# Patient Record
Sex: Male | Born: 1945 | Race: Black or African American | Hispanic: No | Marital: Married | Smoking: Never smoker
Health system: Southern US, Community
[De-identification: ages and names within clinical notes are randomized; demographics above are authoritative.]

## PROBLEM LIST (undated history)

## (undated) DIAGNOSIS — I10 Essential (primary) hypertension: Secondary | ICD-10-CM

## (undated) DIAGNOSIS — M1990 Unspecified osteoarthritis, unspecified site: Secondary | ICD-10-CM

## (undated) HISTORY — PX: NO PAST SURGERIES: SHX2092

---

## 2016-05-19 DIAGNOSIS — T464X5A Adverse effect of angiotensin-converting-enzyme inhibitors, initial encounter: Secondary | ICD-10-CM | POA: Insufficient documentation

## 2016-05-19 DIAGNOSIS — Z87440 Personal history of urinary (tract) infections: Secondary | ICD-10-CM | POA: Insufficient documentation

## 2016-05-19 DIAGNOSIS — N400 Enlarged prostate without lower urinary tract symptoms: Secondary | ICD-10-CM | POA: Insufficient documentation

## 2016-05-19 DIAGNOSIS — I10 Essential (primary) hypertension: Secondary | ICD-10-CM | POA: Insufficient documentation

## 2016-05-19 DIAGNOSIS — I493 Ventricular premature depolarization: Secondary | ICD-10-CM | POA: Insufficient documentation

## 2016-05-19 DIAGNOSIS — R3129 Other microscopic hematuria: Secondary | ICD-10-CM | POA: Insufficient documentation

## 2016-05-19 DIAGNOSIS — Z125 Encounter for screening for malignant neoplasm of prostate: Secondary | ICD-10-CM | POA: Insufficient documentation

## 2016-05-19 DIAGNOSIS — T783XXA Angioneurotic edema, initial encounter: Secondary | ICD-10-CM | POA: Insufficient documentation

## 2018-12-01 DIAGNOSIS — M170 Bilateral primary osteoarthritis of knee: Secondary | ICD-10-CM | POA: Insufficient documentation

## 2019-08-24 DIAGNOSIS — M1612 Unilateral primary osteoarthritis, left hip: Secondary | ICD-10-CM | POA: Insufficient documentation

## 2019-08-24 DIAGNOSIS — M1611 Unilateral primary osteoarthritis, right hip: Secondary | ICD-10-CM | POA: Insufficient documentation

## 2020-05-07 ENCOUNTER — Encounter: Payer: Self-pay | Admitting: Orthopaedic Surgery

## 2020-05-07 ENCOUNTER — Ambulatory Visit: Payer: Self-pay

## 2020-05-07 ENCOUNTER — Ambulatory Visit: Payer: BC Managed Care – PPO | Admitting: Orthopaedic Surgery

## 2020-05-07 VITALS — Ht 71.0 in | Wt 179.0 lb

## 2020-05-07 DIAGNOSIS — M1611 Unilateral primary osteoarthritis, right hip: Secondary | ICD-10-CM | POA: Diagnosis not present

## 2020-05-07 NOTE — Progress Notes (Signed)
Office Visit Note   Patient: Jermaine Mckay           Date of Birth: 1946-03-18           MRN: [id number] Visit Date: 05/07/2020              Requested by: No referring provider defined for this encounter. PCP: Pcp, No   Assessment & Plan: Visit Diagnoses:  1. Primary osteoarthritis of right hip     Plan: Patient has end-stage arthritis of the right hip with total loss of the joint space that is affecting his quality of life.  Recommend right total hip arthroplasty.  Patient is agreeable with proceeding with right total hip arthroplasty in the near future.  Questions encouraged and answered length by Jermaine Mckay and myself.  Risk include but are not limited to nerve vessel injury, blood loss, infection, leg length discrepancy, DVT/PE, prolonged pain and worsening pain.  We will proceed with surgery in the near future and have been follow-up 2 weeks postop.  Handout on total hip arthroplasty was given.  Follow-Up Instructions: Return 2 weeks post op.   Orders:  Orders Placed This Encounter  Procedures  . XR HIP UNILAT W OR W/O PELVIS 1V RIGHT   No orders of the defined types were placed in this encounter.     Procedures: No procedures performed   Clinical Data: No additional findings.   Subjective: Chief Complaint  Patient presents with  . Right Hip - Pain    HPI Jermaine Mckay this is a pleasant 74 year old male who comes in today due to right hip pain has been ongoing for at least a year.  No known injury.  He has pain in the right groin.  No numbness tingling down either leg.  He states his pain is 10 out of 10 pain at worst.  Patient notes that the right hip pain does awaken him.  He has had no injections in the hip.  He does not use any assistive device to ambulate.  Notes that he has difficulty donning shoes and socks particularly on the right side.  Some difficulty on the left with donning shoes and socks.  Review of Systems  Constitutional: Negative for chills and  fever.  Respiratory: Negative for shortness of breath.   Cardiovascular: Negative for chest pain.  Musculoskeletal: Positive for arthralgias.     Objective: Vital Signs: Ht 5\' 11"  (1.803 m)   Wt 179 lb (81.2 kg)   BMI 24.97 kg/m   Physical Exam Constitutional:      Appearance: He is normal weight. He is not ill-appearing or diaphoretic.  Pulmonary:     Effort: Pulmonary effort is normal.  Neurological:     Mental Status: He is alert and oriented to person, place, and time.  Psychiatric:        Mood and Affect: Mood normal.     Ortho Exam Bilateral hips right hip with virtually no internal or external rotation.  Left hip minimal external rotation no internal rotation.  Calves are supple nontender.  Dorsal pedal pulses are intact bilaterally sensation grossly intact bilateral feet.  He has a slight leg length discrepancy with the right leg being slightly shorter than the left.   Specialty Comments:  No specialty comments available.  Imaging: XR HIP UNILAT W OR W/O PELVIS 1V RIGHT  Result Date: 05/07/2020 AP pelvis lateral view right hip: Bilateral hips well located.  Severe arthritis of the left hip end-stage arthritis of the right hip  with periarticular spurring and cystic changes within the femoral head.  No acute fractures noted.    PMFS History: Patient Active Problem List   Diagnosis Date Noted  . Primary osteoarthritis of left hip 08/24/2019  . Primary osteoarthritis of right hip 08/24/2019  . Bilateral primary osteoarthritis of knee 12/01/2018  . Encounter for screening for malignant neoplasm of prostate 05/19/2016  . Essential (primary) hypertension 05/19/2016  . Microscopic hematuria 05/19/2016  . Personal history of urinary (tract) infections 05/19/2016  . Ventricular premature beats 05/19/2016  . Benign prostatic hyperplasia without lower urinary tract symptoms 05/19/2016  . ACE inhibitor-aggravated angioedema 05/19/2016   History reviewed. No pertinent  past medical history.  History reviewed. No pertinent family history.  History reviewed. No pertinent surgical history. Social History   Occupational History  . Not on file  Tobacco Use  . Smoking status: Not on file  Substance and Sexual Activity  . Alcohol use: Not on file  . Drug use: Not on file  . Sexual activity: Not on file

## 2020-05-17 ENCOUNTER — Other Ambulatory Visit: Payer: Self-pay | Admitting: Physician Assistant

## 2020-05-22 ENCOUNTER — Other Ambulatory Visit (HOSPITAL_COMMUNITY): Payer: BC Managed Care – PPO

## 2020-05-22 ENCOUNTER — Other Ambulatory Visit: Payer: Self-pay

## 2020-05-27 NOTE — Patient Instructions (Addendum)
DUE TO COVID-19 ONLY ONE VISITOR IS ALLOWED TO COME WITH YOU AND STAY IN THE WAITING ROOM ONLY DURING PRE OP AND PROCEDURE DAY OF SURGERY. THE 1 VISITOR  MAY VISIT WITH YOU AFTER SURGERY IN YOUR PRIVATE ROOM DURING VISITING HOURS ONLY!  YOU NEED TO HAVE A COVID 19 TEST ON: 05/28/20 @ 12:45 PM , THIS TEST MUST BE DONE BEFORE SURGERY,  COVID TESTING SITE [address] [place] [address], IT IS ON THE RIGHT GOING OUT [address] APPROXIMATELY  2 MINUTES PAST [employer] ON THE RIGHT. ONCE YOUR COVID TEST IS COMPLETED,  PLEASE BEGIN THE QUARANTINE INSTRUCTIONS AS OUTLINED IN YOUR HANDOUT.                Mallie Linnemann   Your procedure is scheduled on: 05/31/20   Report to [facility] Main  Entrance   Report to admitting at: 8:30 AM     Call this number if you have problems the morning of surgery [phone]    Remember:   NO SOLID FOOD AFTER MIDNIGHT THE NIGHT PRIOR TO SURGERY. NOTHING BY MOUTH EXCEPT CLEAR LIQUIDS UNTIL: 8:00 AM . PLEASE FINISH ENSURE DRINK PER SURGEON ORDER  WHICH NEEDS TO BE COMPLETED AT: 8:00 AM .  CLEAR LIQUID DIET   Foods Allowed                                                                     Foods Excluded  Coffee and tea, regular and decaf                             liquids that you cannot  Plain Jell-O any favor except red or purple                                           see through such as: Fruit ices (not with fruit pulp)                                     milk, soups, orange juice  Iced Popsicles                                    All solid food Carbonated beverages, regular and diet                                    Cranberry, grape and apple juices Sports drinks like Gatorade Lightly seasoned clear broth or consume(fat free) Sugar, honey syrup  Sample Menu Breakfast                                Lunch  Supper Cranberry juice                    Beef broth                             Chicken broth Jell-O                                     Grape juice                           Apple juice Coffee or tea                        Jell-O                                      Popsicle                                                Coffee or tea                        Coffee or tea  _____________________________________________________________________   BRUSH YOUR TEETH MORNING OF SURGERY AND RINSE YOUR MOUTH OUT, NO CHEWING GUM CANDY OR MINTS.                                 You may not have any metal on your body including hair pins and              piercings  Do not wear jewelry, lotions, powders or perfumes, deodorant             Men may shave face and neck.   Do not bring valuables to the hospital. [place] IS NOT             RESPONSIBLE   FOR VALUABLES.  Contacts, dentures or bridgework may not be worn into surgery.  Leave suitcase in the car. After surgery it may be brought to your room.     Patients discharged the day of surgery will not be allowed to drive home. IF YOU ARE HAVING SURGERY AND GOING HOME THE SAME DAY, YOU MUST HAVE AN ADULT TO DRIVE YOU HOME AND BE WITH YOU FOR 24 HOURS. YOU MAY GO HOME BY TAXI OR UBER OR ORTHERWISE, BUT AN ADULT MUST ACCOMPANY YOU HOME AND STAY WITH YOU FOR 24 HOURS.  Name and phone number of your driver:  Special Instructions: N/A              Please read over the following fact sheets you were given: _____________________________________________________________________   [facility] Pre[facility] Before surgery, you can play an important role.  Because skin is not sterile, your skin needs to be as free of germs as possible.  You can reduce the number of germs on your skin by washing with CHG (chlorahexidine gluconate) soap before surgery.  CHG is an antiseptic cleaner which kills germs and bonds with the skin to continue killing  germs even after washing. Please DO NOT use if you have an allergy to CHG or antibacterial  soaps.  If your skin becomes reddened/irritated stop using the CHG and inform your nurse when you arrive at Short Stay. Do not shave (including legs and underarms) for at least 48 hours prior to the first CHG shower.  You may shave your face/neck. Please follow these instructions carefully:  1.  Shower with CHG Soap the night before surgery and the  morning of Surgery.  2.  If you choose to wash your hair, wash your hair first as usual with your  normal  shampoo.  3.  After you shampoo, rinse your hair and body thoroughly to remove the  shampoo.                           4.  Use CHG as you would any other liquid soap.  You can apply chg directly  to the skin and wash                       Gently with a scrungie or clean washcloth.  5.  Apply the CHG Soap to your body ONLY FROM THE NECK DOWN.   Do not use on face/ open                           Wound or open sores. Avoid contact with eyes, ears mouth and genitals (private parts).                       Wash face,  Genitals (private parts) with your normal soap.             6.  Wash thoroughly, paying special attention to the area where your surgery  will be performed.  7.  Thoroughly rinse your body with warm water from the neck down.  8.  DO NOT shower/wash with your normal soap after using and rinsing off  the CHG Soap.                9.  Pat yourself dry with a clean towel.            10.  Wear clean pajamas.            11.  Place clean sheets on your bed the night of your first shower and do not  sleep with pets. Day of Surgery : Do not apply any lotions/deodorants the morning of surgery.  Please wear clean clothes to the hospital/surgery center.  FAILURE TO FOLLOW THESE INSTRUCTIONS MAY RESULT IN THE CANCELLATION OF YOUR SURGERY PATIENT SIGNATURE_________________________________  NURSE SIGNATURE__________________________________  ________________________________________________________________________   Rogelia Mire  An  incentive spirometer is a tool that can help keep your lungs clear and active. This tool measures how well you are filling your lungs with each breath. Taking long deep breaths may help reverse or decrease the chance of developing breathing (pulmonary) problems (especially infection) following:  A long period of time when you are unable to move or be active. BEFORE THE PROCEDURE   If the spirometer includes an indicator to show your best effort, your nurse or respiratory therapist will set it to a desired goal.  If possible, sit up straight or lean slightly forward. Try not to slouch.  Hold the incentive spirometer in an upright position. INSTRUCTIONS FOR USE  1. Sit on  the edge of your bed if possible, or sit up as far as you can in bed or on a chair. 2. Hold the incentive spirometer in an upright position. 3. Breathe out normally. 4. Place the mouthpiece in your mouth and seal your lips tightly around it. 5. Breathe in slowly and as deeply as possible, raising the piston or the ball toward the top of the column. 6. Hold your breath for 3-5 seconds or for as long as possible. Allow the piston or ball to fall to the bottom of the column. 7. Remove the mouthpiece from your mouth and breathe out normally. 8. Rest for a few seconds and repeat Steps 1 through 7 at least 10 times every 1-2 hours when you are awake. Take your time and take a few normal breaths between deep breaths. 9. The spirometer may include an indicator to show your best effort. Use the indicator as a goal to work toward during each repetition. 10. After each set of 10 deep breaths, practice coughing to be sure your lungs are clear. If you have an incision (the cut made at the time of surgery), support your incision when coughing by placing a pillow or rolled up towels firmly against it. Once you are able to get out of bed, walk around indoors and cough well. You may stop using the incentive spirometer when instructed by your  caregiver.  RISKS AND COMPLICATIONS  Take your time so you do not get dizzy or light-headed.  If you are in pain, you may need to take or ask for pain medication before doing incentive spirometry. It is harder to take a deep breath if you are having pain. AFTER USE  Rest and breathe slowly and easily.  It can be helpful to keep track of a log of your progress. Your caregiver can provide you with a simple table to help with this. If you are using the spirometer at home, follow these instructions: SEEK MEDICAL CARE IF:   You are having difficultly using the spirometer.  You have trouble using the spirometer as often as instructed.  Your pain medication is not giving enough relief while using the spirometer.  You develop fever of 100.5 F (38.1 C) or higher. SEEK IMMEDIATE MEDICAL CARE IF:   You cough up bloody sputum that had not been present before.  You develop fever of 102 F (38.9 C) or greater.  You develop worsening pain at or near the incision site. MAKE SURE YOU:   Understand these instructions.  Will watch your condition.  Will get help right away if you are not doing well or get worse. Document Released: 11/23/2006 Document Revised: 10/05/2011 Document Reviewed: 01/24/2007 [facility] Patient Information 2014 [place], [place].   ________________________________________________________________________

## 2020-05-28 ENCOUNTER — Other Ambulatory Visit: Payer: Self-pay

## 2020-05-28 ENCOUNTER — Encounter (HOSPITAL_COMMUNITY)
Admission: RE | Admit: 2020-05-28 | Discharge: 2020-05-28 | Disposition: A | Discharge status: Home / Self Care | Payer: BC Managed Care – PPO | Source: Ambulatory Visit | Attending: Orthopaedic Surgery | Admitting: Orthopaedic Surgery

## 2020-05-28 ENCOUNTER — Encounter (HOSPITAL_COMMUNITY): Payer: Self-pay

## 2020-05-28 ENCOUNTER — Other Ambulatory Visit (HOSPITAL_COMMUNITY)
Admission: RE | Admit: 2020-05-28 | Discharge: 2020-05-28 | Disposition: A | Discharge status: Home / Self Care | Payer: BC Managed Care – PPO | Source: Ambulatory Visit | Attending: Orthopaedic Surgery | Admitting: Orthopaedic Surgery

## 2020-05-28 DIAGNOSIS — Z20822 Contact with and (suspected) exposure to covid-19: Secondary | ICD-10-CM | POA: Insufficient documentation

## 2020-05-28 DIAGNOSIS — Z01818 Encounter for other preprocedural examination: Secondary | ICD-10-CM | POA: Diagnosis not present

## 2020-05-28 HISTORY — DX: Unspecified osteoarthritis, unspecified site: M19.90

## 2020-05-28 HISTORY — DX: Essential (primary) hypertension: I10

## 2020-05-28 LAB — CBC
HCT: 43.5 % (ref 39.0–52.0)
Hemoglobin: 13.7 g/dL (ref 13.0–17.0)
MCH: 28 pg (ref 26.0–34.0)
MCHC: 31.5 g/dL (ref 30.0–36.0)
MCV: 89 fL (ref 80.0–100.0)
Platelets: 264 10*3/uL (ref 150–400)
RBC: 4.89 MIL/uL (ref 4.22–5.81)
RDW: 14.2 % (ref 11.5–15.5)
WBC: 4.1 10*3/uL (ref 4.0–10.5)
nRBC: 0 % (ref 0.0–0.2)

## 2020-05-28 LAB — SURGICAL PCR SCREEN
MRSA, PCR: NEGATIVE
Staphylococcus aureus: NEGATIVE

## 2020-05-28 LAB — BASIC METABOLIC PANEL
Anion gap: 10 (ref 5–15)
BUN: 13 mg/dL (ref 8–23)
CO2: 26 mmol/L (ref 22–32)
Calcium: 9.3 mg/dL (ref 8.9–10.3)
Chloride: 104 mmol/L (ref 98–111)
Creatinine, Ser: 0.8 mg/dL (ref 0.61–1.24)
GFR, Estimated: >60 mL/min (ref >60)
Glucose, Bld: 90 mg/dL (ref 70–99)
Potassium: 3.8 mmol/L (ref 3.5–5.1)
Sodium: 140 mmol/L (ref 135–145)

## 2020-05-28 LAB — SARS CORONAVIRUS 2 (TAT 6-24 HRS): SARS Coronavirus 2: NEGATIVE

## 2020-05-28 NOTE — Progress Notes (Signed)
COVID Vaccine Completed: Yes Date COVID Vaccine completed: 09/27/19 COVID vaccine manufacturer:     Moderna     PCP - Dr. Bebe Liter: [facility]: [place] Cardiologist -   Chest x-ray -  EKG -  Stress Test -  ECHO -  Cardiac Cath -  Pacemaker/ICD device last checked:  Sleep Study -  CPAP -   Fasting Blood Sugar -  Checks Blood Sugar _____ times a day  Blood Thinner Instructions: Aspirin Instructions: Last Dose:  Anesthesia review: Hx: HTN  Patient denies shortness of breath, fever, cough and chest pain at PAT appointment   Patient verbalized understanding of instructions that were given to them at the PAT appointment. Patient was also instructed that they will need to review over the PAT instructions again at home before surgery.

## 2020-05-30 NOTE — H&P (Signed)
TOTAL HIP ADMISSION H&P  Patient is admitted for right total hip arthroplasty.  Subjective:  Chief Complaint: right hip pain  HPI: Jermaine Mckay, 74 y.o. male, has a history of pain and functional disability in the right hip(s) due to arthritis and patient has failed non-surgical conservative treatments for greater than 12 weeks to include NSAID's and/or analgesics, flexibility and strengthening excercises, use of assistive devices and activity modification.  Onset of symptoms was gradual starting 2 years ago with gradually worsening course since that time.The patient noted no past surgery on the right hip(s).  Patient currently rates pain in the right hip at 10 out of 10 with activity. Patient has night pain, worsening of pain with activity and weight bearing, trendelenberg gait, pain that interfers with activities of daily living and pain with passive range of motion. Patient has evidence of subchondral cysts, subchondral sclerosis, periarticular osteophytes and joint space narrowing by imaging studies. This condition presents safety issues increasing the risk of falls.  There is no current active infection.  Patient Active Problem List   Diagnosis Date Noted  . Primary osteoarthritis of left hip 08/24/2019  . Primary osteoarthritis of right hip 08/24/2019  . Bilateral primary osteoarthritis of knee 12/01/2018  . Encounter for screening for malignant neoplasm of prostate 05/19/2016  . Essential (primary) hypertension 05/19/2016  . Microscopic hematuria 05/19/2016  . Personal history of urinary (tract) infections 05/19/2016  . Ventricular premature beats 05/19/2016  . Benign prostatic hyperplasia without lower urinary tract symptoms 05/19/2016  . 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 Arthritis   . Hypertension     Past Surgical History:  Procedure Laterality Date  . NO PAST SURGERIES      No current facility-administered medications  for this encounter.   Current Outpatient Medications  Medication Sig Dispense Refill Last Dose  . acetaminophen (TYLENOL) 500 MG tablet Take 500 mg by mouth every 6 (six) hours as needed for moderate pain.     Marland Kitchen amLODipine (NORVASC) 10 MG tablet Take 10 mg by mouth daily.      . Multiple Vitamin (DAILY VITES) tablet Take 1 tablet by mouth daily.     . sulindac (CLINORIL) 200 MG tablet Take by mouth. (Patient not taking: Reported on 05/21/2020)   Not Taking at Unknown time  . tamsulosin (FLOMAX) 0.4 MG CAPS capsule Take by mouth. (Patient not taking: Reported on 05/21/2020)   Not Taking at Unknown time   Allergies  Allergen Reactions  . Ace Inhibitors Anaphylaxis and Swelling    Social History   Tobacco Use  . Smoking status: Never Smoker  . Smokeless tobacco: Never Used  Substance Use Topics  . Alcohol use: Never    No family history on file.   Review of Systems  Musculoskeletal: Positive for gait problem.  All other systems reviewed and are negative.   Objective:  Physical Exam Vitals reviewed.  Constitutional:      Appearance: Normal appearance.  HENT:     Head: Normocephalic and atraumatic.  Eyes:     Extraocular Movements: Extraocular movements intact.     Pupils: Pupils are equal, round, and reactive to light.  Cardiovascular:     Rate and Rhythm: Normal rate.     Pulses: Normal pulses.  Pulmonary:     Effort: Pulmonary effort is normal.  Abdominal:     Palpations: Abdomen is soft.  Musculoskeletal:     Cervical back: Normal range of motion.  Right hip: Tenderness and bony tenderness present. Decreased range of motion. Decreased strength.  Neurological:     Mental Status: He is alert and oriented to person, place, and time.  Psychiatric:        Behavior: Behavior normal.     Vital signs in last 24 hours:    Labs:   Estimated body mass index is 24.13 kg/m as calculated from the following:   Height as of 05/28/20: 5\' 11"  (1.803 m).   Weight as of  05/28/20: 78.5 kg.   Imaging Review Plain radiographs demonstrate severe degenerative joint disease of the right hip(s). The bone quality appears to be good for age and reported activity level.      Assessment/Plan:  End stage arthritis, right hip(s)  The patient history, physical examination, clinical judgement of the provider and imaging studies are consistent with end stage degenerative joint disease of the right hip(s) and total hip arthroplasty is deemed medically necessary. The treatment options including medical management, injection therapy, arthroscopy and arthroplasty were discussed at length. The risks and benefits of total hip arthroplasty were presented and reviewed. The risks due to aseptic loosening, infection, stiffness, dislocation/subluxation,  thromboembolic complications and other imponderables were discussed.  The patient acknowledged the explanation, agreed to proceed with the plan and consent was signed. Patient is being admitted for inpatient treatment for surgery, pain control, PT, OT, prophylactic antibiotics, VTE prophylaxis, progressive ambulation and ADL's and discharge planning.The patient is planning to be discharged home with home health services

## 2020-05-31 ENCOUNTER — Observation Stay (HOSPITAL_COMMUNITY): Payer: BC Managed Care – PPO

## 2020-05-31 ENCOUNTER — Ambulatory Visit (HOSPITAL_COMMUNITY): Payer: BC Managed Care – PPO | Admitting: Anesthesiology

## 2020-05-31 ENCOUNTER — Other Ambulatory Visit: Payer: Self-pay

## 2020-05-31 ENCOUNTER — Ambulatory Visit (HOSPITAL_COMMUNITY): Payer: BC Managed Care – PPO

## 2020-05-31 ENCOUNTER — Observation Stay (HOSPITAL_COMMUNITY)
Admission: RE | Admit: 2020-05-31 | Discharge: 2020-06-01 | Disposition: A | Discharge status: Home / Self Care | Payer: BC Managed Care – PPO | Attending: Orthopaedic Surgery | Admitting: Orthopaedic Surgery

## 2020-05-31 ENCOUNTER — Encounter (HOSPITAL_COMMUNITY)
Admission: RE | Disposition: A | Discharge status: Home / Self Care | Payer: Self-pay | Source: Home / Self Care | Attending: Orthopaedic Surgery

## 2020-05-31 ENCOUNTER — Encounter (HOSPITAL_COMMUNITY): Payer: Self-pay | Admitting: Orthopaedic Surgery

## 2020-05-31 DIAGNOSIS — I10 Essential (primary) hypertension: Secondary | ICD-10-CM | POA: Insufficient documentation

## 2020-05-31 DIAGNOSIS — Z79899 Other long term (current) drug therapy: Secondary | ICD-10-CM | POA: Insufficient documentation

## 2020-05-31 DIAGNOSIS — M1611 Unilateral primary osteoarthritis, right hip: Secondary | ICD-10-CM | POA: Diagnosis not present

## 2020-05-31 DIAGNOSIS — M25551 Pain in right hip: Secondary | ICD-10-CM | POA: Diagnosis present

## 2020-05-31 DIAGNOSIS — Z419 Encounter for procedure for purposes other than remedying health state, unspecified: Secondary | ICD-10-CM

## 2020-05-31 DIAGNOSIS — Z96641 Presence of right artificial hip joint: Secondary | ICD-10-CM

## 2020-05-31 HISTORY — PX: TOTAL HIP ARTHROPLASTY: SHX124

## 2020-05-31 LAB — ABO/RH: ABO/RH(D): O POS

## 2020-05-31 LAB — TYPE AND SCREEN
ABO/RH(D): O POS
Antibody Screen: NEGATIVE

## 2020-05-31 SURGERY — ARTHROPLASTY, HIP, TOTAL, ANTERIOR APPROACH
Anesthesia: Spinal | Site: Hip | Laterality: Right

## 2020-05-31 MED ORDER — FENTANYL CITRATE (PF) 100 MCG/2ML IJ SOLN
INTRAMUSCULAR | Status: AC
  Filled 2020-05-31: qty 2

## 2020-05-31 MED ORDER — GABAPENTIN 100 MG PO CAPS
100.0000 mg | ORAL_CAPSULE | Freq: Three times a day (TID) | ORAL | Status: DC
  Administered 2020-05-31 – 2020-06-01 (×2): 100 mg via ORAL
  Filled 2020-05-31 (×2): qty 1

## 2020-05-31 MED ORDER — PHENYLEPHRINE 40 MCG/ML (10ML) SYRINGE FOR IV PUSH (FOR BLOOD PRESSURE SUPPORT)
PREFILLED_SYRINGE | INTRAVENOUS | Status: AC
  Filled 2020-05-31: qty 20

## 2020-05-31 MED ORDER — PROPOFOL 500 MG/50ML IV EMUL
INTRAVENOUS | Status: DC | PRN
  Administered 2020-05-31: 100 ug/kg/min via INTRAVENOUS

## 2020-05-31 MED ORDER — LACTATED RINGERS IV SOLN: INTRAVENOUS | Status: DC | PRN

## 2020-05-31 MED ORDER — METHOCARBAMOL 500 MG PO TABS
500.0000 mg | ORAL_TABLET | Freq: Four times a day (QID) | ORAL | Status: DC | PRN
  Administered 2020-05-31 – 2020-06-01 (×2): 500 mg via ORAL
  Filled 2020-05-31 (×2): qty 1

## 2020-05-31 MED ORDER — DEXAMETHASONE SODIUM PHOSPHATE 10 MG/ML IJ SOLN
INTRAMUSCULAR | Status: DC | PRN
  Administered 2020-05-31: 10 mg via INTRAVENOUS

## 2020-05-31 MED ORDER — ONDANSETRON HCL 4 MG PO TABS: 4.0000 mg | ORAL_TABLET | Freq: Four times a day (QID) | ORAL | Status: DC | PRN

## 2020-05-31 MED ORDER — ASPIRIN 81 MG PO CHEW
81.0000 mg | CHEWABLE_TABLET | Freq: Two times a day (BID) | ORAL | Status: DC
  Administered 2020-05-31 – 2020-06-01 (×2): 81 mg via ORAL
  Filled 2020-05-31 (×2): qty 1

## 2020-05-31 MED ORDER — ONDANSETRON HCL 4 MG/2ML IJ SOLN: 4.0000 mg | Freq: Once | INTRAMUSCULAR | Status: DC | PRN

## 2020-05-31 MED ORDER — METHOCARBAMOL 1000 MG/10ML IJ SOLN
500.0000 mg | Freq: Four times a day (QID) | INTRAVENOUS | Status: DC | PRN
  Filled 2020-05-31: qty 5

## 2020-05-31 MED ORDER — POVIDONE-IODINE 10 % EX SWAB
2.0000 "application " | Freq: Once | CUTANEOUS | Status: AC
  Administered 2020-05-31: 2 via TOPICAL

## 2020-05-31 MED ORDER — CEFAZOLIN SODIUM-DEXTROSE 2-4 GM/100ML-% IV SOLN
2.0000 g | INTRAVENOUS | Status: AC
  Administered 2020-05-31: 2 g via INTRAVENOUS
  Filled 2020-05-31: qty 100

## 2020-05-31 MED ORDER — ONDANSETRON HCL 4 MG/2ML IJ SOLN
INTRAMUSCULAR | Status: DC | PRN
  Administered 2020-05-31: 4 mg via INTRAVENOUS

## 2020-05-31 MED ORDER — PROPOFOL 1000 MG/100ML IV EMUL
INTRAVENOUS | Status: AC
  Filled 2020-05-31: qty 100

## 2020-05-31 MED ORDER — PANTOPRAZOLE SODIUM 40 MG PO TBEC
40.0000 mg | DELAYED_RELEASE_TABLET | Freq: Every day | ORAL | Status: DC
  Administered 2020-06-01: 40 mg via ORAL
  Filled 2020-05-31: qty 1

## 2020-05-31 MED ORDER — ACETAMINOPHEN 325 MG PO TABS
325.0000 mg | ORAL_TABLET | Freq: Four times a day (QID) | ORAL | Status: DC | PRN
  Administered 2020-05-31: 650 mg via ORAL
  Filled 2020-05-31: qty 2

## 2020-05-31 MED ORDER — PHENOL 1.4 % MT LIQD: 1.0000 | OROMUCOSAL | Status: DC | PRN

## 2020-05-31 MED ORDER — HYDROMORPHONE HCL 1 MG/ML IJ SOLN: 0.5000 mg | INTRAMUSCULAR | Status: DC | PRN

## 2020-05-31 MED ORDER — DOCUSATE SODIUM 100 MG PO CAPS
100.0000 mg | ORAL_CAPSULE | Freq: Two times a day (BID) | ORAL | Status: DC
  Administered 2020-05-31 – 2020-06-01 (×2): 100 mg via ORAL
  Filled 2020-05-31 (×2): qty 1

## 2020-05-31 MED ORDER — CHLORHEXIDINE GLUCONATE CLOTH 2 % EX PADS: 6.0000 | MEDICATED_PAD | Freq: Every day | CUTANEOUS | Status: DC

## 2020-05-31 MED ORDER — PHENYLEPHRINE HCL (PRESSORS) 10 MG/ML IV SOLN
INTRAVENOUS | Status: DC | PRN
  Administered 2020-05-31: 120 ug via INTRAVENOUS
  Administered 2020-05-31 (×6): 80 ug via INTRAVENOUS

## 2020-05-31 MED ORDER — AMLODIPINE BESYLATE 10 MG PO TABS
10.0000 mg | ORAL_TABLET | Freq: Every day | ORAL | Status: DC
  Administered 2020-05-31 – 2020-06-01 (×2): 10 mg via ORAL
  Filled 2020-05-31 (×2): qty 1

## 2020-05-31 MED ORDER — BUPIVACAINE IN DEXTROSE 0.75-8.25 % IT SOLN
INTRATHECAL | Status: DC | PRN
  Administered 2020-05-31: 1.6 mL via INTRATHECAL

## 2020-05-31 MED ORDER — PHENYLEPHRINE HCL-NACL 10-0.9 MG/250ML-% IV SOLN
INTRAVENOUS | Status: DC | PRN
  Administered 2020-05-31: 40 ug/min via INTRAVENOUS

## 2020-05-31 MED ORDER — OXYCODONE HCL 5 MG PO TABS
5.0000 mg | ORAL_TABLET | Freq: Four times a day (QID) | ORAL | 0 refills | Status: AC | PRN
Start: 2020-05-31 — End: ?

## 2020-05-31 MED ORDER — DIPHENHYDRAMINE HCL 12.5 MG/5ML PO ELIX: 12.5000 mg | ORAL_SOLUTION | ORAL | Status: DC | PRN

## 2020-05-31 MED ORDER — FENTANYL CITRATE (PF) 100 MCG/2ML IJ SOLN: 25.0000 ug | INTRAMUSCULAR | Status: DC | PRN

## 2020-05-31 MED ORDER — METOCLOPRAMIDE HCL 5 MG PO TABS: 5.0000 mg | ORAL_TABLET | Freq: Three times a day (TID) | ORAL | Status: DC | PRN

## 2020-05-31 MED ORDER — ALUM & MAG HYDROXIDE-SIMETH 200-200-20 MG/5ML PO SUSP: 30.0000 mL | ORAL | Status: DC | PRN

## 2020-05-31 MED ORDER — TRANEXAMIC ACID-NACL 1000-0.7 MG/100ML-% IV SOLN
1000.0000 mg | INTRAVENOUS | Status: AC
  Administered 2020-05-31: 1000 mg via INTRAVENOUS
  Filled 2020-05-31: qty 100

## 2020-05-31 MED ORDER — METHOCARBAMOL 500 MG PO TABS: 500.0000 mg | ORAL_TABLET | Freq: Four times a day (QID) | ORAL | 1 refills | Status: AC | PRN

## 2020-05-31 MED ORDER — MENTHOL 3 MG MT LOZG: 1.0000 | LOZENGE | OROMUCOSAL | Status: DC | PRN

## 2020-05-31 MED ORDER — SODIUM CHLORIDE 0.9 % IR SOLN
Status: DC | PRN
  Administered 2020-05-31: 1000 mL

## 2020-05-31 MED ORDER — SODIUM CHLORIDE 0.9 % IV SOLN: INTRAVENOUS | Status: DC

## 2020-05-31 MED ORDER — OXYCODONE HCL 5 MG PO TABS: 10.0000 mg | ORAL_TABLET | ORAL | Status: DC | PRN

## 2020-05-31 MED ORDER — LACTATED RINGERS IV SOLN: INTRAVENOUS | Status: DC

## 2020-05-31 MED ORDER — ONDANSETRON HCL 4 MG/2ML IJ SOLN: 4.0000 mg | Freq: Four times a day (QID) | INTRAMUSCULAR | Status: DC | PRN

## 2020-05-31 MED ORDER — OXYCODONE HCL 5 MG PO TABS
5.0000 mg | ORAL_TABLET | ORAL | Status: DC | PRN
  Administered 2020-05-31 – 2020-06-01 (×3): 5 mg via ORAL
  Filled 2020-05-31 (×3): qty 1

## 2020-05-31 MED ORDER — CHLORHEXIDINE GLUCONATE 0.12 % MT SOLN
15.0000 mL | Freq: Once | OROMUCOSAL | Status: AC
  Administered 2020-05-31: 15 mL via OROMUCOSAL

## 2020-05-31 MED ORDER — ASPIRIN 81 MG PO CHEW: 81.0000 mg | CHEWABLE_TABLET | Freq: Two times a day (BID) | ORAL | 0 refills | Status: AC

## 2020-05-31 MED ORDER — ACETAMINOPHEN 500 MG PO TABS
1000.0000 mg | ORAL_TABLET | Freq: Once | ORAL | Status: AC
  Administered 2020-05-31: 1000 mg via ORAL
  Filled 2020-05-31: qty 2

## 2020-05-31 MED ORDER — METOCLOPRAMIDE HCL 5 MG/ML IJ SOLN: 5.0000 mg | Freq: Three times a day (TID) | INTRAMUSCULAR | Status: DC | PRN

## 2020-05-31 MED ORDER — ORAL CARE MOUTH RINSE: 15.0000 mL | Freq: Once | OROMUCOSAL | Status: AC

## 2020-05-31 MED ORDER — FENTANYL CITRATE (PF) 100 MCG/2ML IJ SOLN
INTRAMUSCULAR | Status: DC | PRN
Start: 2020-05-31 — End: 2020-05-31
  Administered 2020-05-31 (×2): 50 ug via INTRAVENOUS

## 2020-05-31 MED ORDER — 0.9 % SODIUM CHLORIDE (POUR BTL) OPTIME
TOPICAL | Status: DC | PRN
  Administered 2020-05-31: 1000 mL

## 2020-05-31 SURGICAL SUPPLY — 42 items
ARTICULEZE HEAD (Hips) ×3 IMPLANT
BAG ZIPLOCK 12X15 (MISCELLANEOUS) IMPLANT
BENZOIN TINCTURE PRP APPL 2/3 (GAUZE/BANDAGES/DRESSINGS) IMPLANT
BLADE SAW SGTL 18X1.27X75 (BLADE) ×2 IMPLANT
BLADE SAW SGTL 18X1.27X75MM (BLADE) ×1
CLOSURE WOUND 1/2 X4 (GAUZE/BANDAGES/DRESSINGS)
COLLAR OFFSET CORAIL SZ 12 HIP (Stem) ×1 IMPLANT
CORAIL OFFSET COLLAR SZ 12 HIP (Stem) ×3 IMPLANT
COVER PERINEAL POST (MISCELLANEOUS) ×3 IMPLANT
COVER SURGICAL LIGHT HANDLE (MISCELLANEOUS) ×3 IMPLANT
COVER WAND RF STERILE (DRAPES) ×3 IMPLANT
CUP ACET PINNACLE SECTR 60MM (Hips) ×1 IMPLANT
DRAPE STERI IOBAN 125X83 (DRAPES) ×3 IMPLANT
DRAPE U-SHAPE 47X51 STRL (DRAPES) ×6 IMPLANT
DRSG AQUACEL AG ADV 3.5X10 (GAUZE/BANDAGES/DRESSINGS) ×3 IMPLANT
DURAPREP 26ML APPLICATOR (WOUND CARE) ×3 IMPLANT
ELECT REM PT RETURN 15FT ADLT (MISCELLANEOUS) ×3 IMPLANT
GAUZE XEROFORM 1X8 LF (GAUZE/BANDAGES/DRESSINGS) ×3 IMPLANT
GLOVE BIO SURGEON STRL SZ7.5 (GLOVE) ×3 IMPLANT
GLOVE BIOGEL PI IND STRL 8 (GLOVE) ×2 IMPLANT
GLOVE BIOGEL PI INDICATOR 8 (GLOVE) ×4
GLOVE ECLIPSE 8.0 STRL XLNG CF (GLOVE) ×3 IMPLANT
GOWN STRL REUS W/TWL XL LVL3 (GOWN DISPOSABLE) ×6 IMPLANT
HANDPIECE INTERPULSE COAX TIP (DISPOSABLE) ×2
HEAD ARTICULEZE (Hips) ×1 IMPLANT
HOLDER FOLEY CATH W/STRAP (MISCELLANEOUS) ×3 IMPLANT
KIT TURNOVER KIT A (KITS) IMPLANT
LINER NEUTRAL 58X36MM PLUS4 ×3 IMPLANT
PACK ANTERIOR HIP CUSTOM (KITS) ×3 IMPLANT
PENCIL SMOKE EVACUATOR (MISCELLANEOUS) ×3 IMPLANT
PINNSECTOR W/GRIP ACE CUP 60MM (Hips) ×3 IMPLANT
SET HNDPC FAN SPRY TIP SCT (DISPOSABLE) ×1 IMPLANT
STAPLER VISISTAT 35W (STAPLE) ×3 IMPLANT
STRIP CLOSURE SKIN 1/2X4 (GAUZE/BANDAGES/DRESSINGS) IMPLANT
SUT ETHIBOND NAB CT1 #1 30IN (SUTURE) ×3 IMPLANT
SUT ETHILON 2 0 PS N (SUTURE) IMPLANT
SUT MNCRL AB 4-0 PS2 18 (SUTURE) IMPLANT
SUT VIC AB 0 CT1 36 (SUTURE) ×3 IMPLANT
SUT VIC AB 1 CT1 36 (SUTURE) ×3 IMPLANT
SUT VIC AB 2-0 CT1 27 (SUTURE) ×4
SUT VIC AB 2-0 CT1 TAPERPNT 27 (SUTURE) ×2 IMPLANT
TRAY FOLEY MTR SLVR 16FR STAT (SET/KITS/TRAYS/PACK) ×3 IMPLANT

## 2020-05-31 NOTE — Anesthesia Postprocedure Evaluation (Signed)
Anesthesia Post Note  Patient: Jermaine Mckay  Procedure(s) Performed: RIGHT TOTAL HIP ARTHROPLASTY ANTERIOR APPROACH (Right Hip)     Patient location during evaluation: PACU Anesthesia Type: Spinal Level of consciousness: oriented, awake and alert and awake Pain management: pain level controlled Vital Signs Assessment: post-procedure vital signs reviewed and stable Respiratory status: spontaneous breathing, respiratory function stable, patient connected to nasal cannula oxygen and nonlabored ventilation Cardiovascular status: blood pressure returned to baseline and stable Postop Assessment: no headache, no backache, no apparent nausea or vomiting, patient able to bend at knees and spinal receding Anesthetic complications: no   No complications documented.  Last Vitals:  Vitals:   05/31/20 1500 05/31/20 1530  BP: 125/76 123/75  Pulse: 66 79  Resp: 12 16  Temp: (!) 36.3 C 36.7 C  SpO2: 100% 100%    Last Pain:  Vitals:   05/31/20 1530  TempSrc: Oral  PainSc:                  Cecile Hearing

## 2020-05-31 NOTE — Evaluation (Signed)
Physical Therapy Evaluation Patient Details Name: Jermaine Mckay MRN: [id number] DOB: 04-11-1946 Today's Date: 05/31/2020   History of Present Illness  Patient is 74 y.o. male s/p Rt THA anterior approach on 05/31/20 with PMH significant for HTN, OA.  Clinical Impression  [employer] is a 74 y.o. male POD 0 s/p Rt THA. Patient reports independence with mobility at baseline. Patient is now limited by functional impairments (see PT problem list below) and requires min assist/guard for transfers and gait with RW. Patient was able to ambulate ~75 feet with RW and min assist. Patient instructed in exercise to facilitate strength and circulation. Patient will benefit from continued skilled PT interventions to address impairments and progress towards PLOF. Acute PT will follow to progress mobility and stair training in preparation for safe discharge home.     Follow Up Recommendations Follow surgeon's recommendation for DC plan and follow-up therapies;Outpatient PT    Equipment Recommendations  None recommended by PT    Recommendations for Other Services       Precautions / Restrictions Precautions Precautions: Fall Restrictions Weight Bearing Restrictions: No Other Position/Activity Restrictions: WBAT      Mobility  Bed Mobility Overal bed mobility: Needs Assistance Bed Mobility: Supine to Sit     Supine to sit: Min guard;HOB elevated     General bed mobility comments: cues to use bed rail and min guard for safety. no physical assist needed.    Transfers Overall transfer level: Needs assistance Equipment used: Rolling walker (2 wheeled) Transfers: Sit to/from Stand Sit to Stand: Min guard;Min assist         General transfer comment: cues for safe hand placement on RW, light assist required to steady with rising.  Ambulation/Gait Ambulation/Gait assistance: Min assist Gait Distance (Feet): 75 Feet Assistive device: Rolling walker (2 wheeled) Gait Pattern/deviations:  Step-through pattern;Decreased stride length Gait velocity: decr   General Gait Details: cues for step pattern and proximity to RW. pt did better with step through pattern for gait compared to step to.   Stairs            Wheelchair Mobility    Modified Rankin (Stroke Patients Only)       Balance Overall balance assessment: Needs assistance Sitting-balance support: Feet supported Sitting balance-Leahy Scale: Good     Standing balance support: During functional activity;Bilateral upper extremity supported Standing balance-Leahy Scale: Fair                               Pertinent Vitals/Pain Pain Assessment: No/denies pain    Home Living Family/patient expects to be discharged to:: Private residence Living Arrangements: Spouse/significant other Available Help at Discharge: Family Type of Home: House Home Access: Stairs to enter Entrance Stairs-Rails: None Entrance Stairs-Number of Steps: 1+1 Home Layout: One level Home Equipment: Environmental consultant - 2 wheels;Cane - single point;Grab bars - tub/shower;Bedside commode      Prior Function Level of Independence: Independent         Comments: pt works as Arboriculturist at [employer]   Dominant Hand: Right    Extremity/Trunk Assessment   Upper Extremity Assessment Upper Extremity Assessment: Overall WFL for tasks assessed    Lower Extremity Assessment Lower Extremity Assessment: Overall WFL for tasks assessed    Cervical / Trunk Assessment Cervical / Trunk Assessment: Normal  Communication   Communication: No difficulties  Cognition Arousal/Alertness: Awake/alert Behavior During Therapy: WFL for tasks assessed/performed Overall  Cognitive Status: Within Functional Limits for tasks assessed                                        General Comments      Exercises Total Joint Exercises Ankle Circles/Pumps: AROM;Both;20 reps;Seated Long Arc Quad: AROM;Right;10  reps;Seated   Assessment/Plan    PT Assessment Patient needs continued PT services  PT Problem List Decreased strength;Decreased range of motion;Decreased activity tolerance;Decreased balance;Decreased mobility;Decreased knowledge of use of DME;Decreased knowledge of precautions       PT Treatment Interventions DME instruction;Gait training;Functional mobility training;Stair training;Therapeutic activities;Therapeutic exercise;Balance training;Patient/family education    PT Goals (Current goals can be found in the Care Plan section)  Acute Rehab PT Goals Patient Stated Goal: recover and get back outside working in yard PT Goal Formulation: With patient Time For Goal Achievement: 06/07/20 Potential to Achieve Goals: Good    Frequency 7X/week   Barriers to discharge        Co-evaluation               AM-PAC PT "6 Clicks" Mobility  Outcome Measure Help needed turning from your back to your side while in a flat bed without using bedrails?: None Help needed moving from lying on your back to sitting on the side of a flat bed without using bedrails?: A Little Help needed moving to and from a bed to a chair (including a wheelchair)?: A Little Help needed standing up from a chair using your arms (e.g., wheelchair or bedside chair)?: A Little Help needed to walk in hospital room?: A Little Help needed climbing 3-5 steps with a railing? : A Little 6 Click Score: 19    End of Session Equipment Utilized During Treatment: Gait belt Activity Tolerance: Patient tolerated treatment well Patient left: in chair;with call bell/phone within reach;with chair alarm set;with family/visitor present Nurse Communication: Mobility status PT Visit Diagnosis: Muscle weakness (generalized) (M62.81);Difficulty in walking, not elsewhere classified (R26.2)    Time: 4650-3546 PT Time Calculation (min) (ACUTE ONLY): 26 min   Charges:   PT Evaluation $PT Eval Low Complexity: 1 Low PT  Treatments $Gait Training: 8-22 mins        Wynn Maudlin, DPT Acute Rehabilitation Services  Office [phone] Pager [phone]  05/31/2020 5:39 PM

## 2020-05-31 NOTE — Anesthesia Procedure Notes (Signed)
Spinal  Patient location during procedure: OR Start time: 05/31/2020 11:28 AM End time: 05/31/2020 11:31 AM Staffing Performed: anesthesiologist  Anesthesiologist: Cecile Hearing, MD Preanesthetic Checklist Completed: patient identified, IV checked, risks and benefits discussed, surgical consent, monitors and equipment checked, pre-op evaluation and timeout performed Spinal Block Patient position: sitting Prep: DuraPrep and site prepped and draped Patient monitoring: continuous pulse ox and blood pressure Approach: midline Location: L3-4 Injection technique: single-shot Needle Needle type: Pencan  Needle gauge: 24 G Additional Notes Functioning IV was confirmed and monitors were applied. Sterile prep and drape, including hand hygiene, mask and sterile gloves were used. The patient was positioned and the spine was prepped. The skin was anesthetized with lidocaine.  Free flow of clear CSF was obtained prior to injecting local anesthetic into the CSF.  The spinal needle aspirated freely following injection.  The needle was carefully withdrawn.  The patient tolerated the procedure well. Consent was obtained prior to procedure with all questions answered and concerns addressed. Risks including but not limited to bleeding, infection, nerve damage, paralysis, failed block, inadequate analgesia, allergic reaction, high spinal, itching and headache were discussed and the patient wished to proceed.   Arrie Aran, MD

## 2020-05-31 NOTE — Anesthesia Preprocedure Evaluation (Addendum)
Anesthesia Evaluation  Patient identified by MRN, date of birth, ID band Patient awake    Reviewed: Allergy & Precautions, NPO status , Patient's Chart, lab work & pertinent test results  Airway Mallampati: II  TM Distance: >3 FB Neck ROM: Full    Dental  (+) Upper Dentures, Partial Lower   Pulmonary neg pulmonary ROS,    Pulmonary exam normal breath sounds clear to auscultation       Cardiovascular hypertension, Pt. on medications Normal cardiovascular exam Rhythm:Regular Rate:Normal     Neuro/Psych negative neurological ROS     GI/Hepatic negative GI ROS, Neg liver ROS,   Endo/Other  negative endocrine ROS  Renal/GU negative Renal ROS     Musculoskeletal  (+) Arthritis , Osteoarthritis,    Abdominal   Peds  Hematology negative hematology ROS (+) Plt 264k   Anesthesia Other Findings Day of surgery medications reviewed with the patient.  Reproductive/Obstetrics                            Anesthesia Physical Anesthesia Plan  ASA: II  Anesthesia Plan: Spinal   Post-op Pain Management:    Induction: Intravenous  PONV Risk Score and Plan: 1 and Treatment may vary due to age or medical condition, Propofol infusion and Ondansetron  Airway Management Planned: Natural Airway and Nasal Cannula  Additional Equipment:   Intra-op Plan:   Post-operative Plan:   Informed Consent: I have reviewed the patients History and Physical, chart, labs and discussed the procedure including the risks, benefits and alternatives for the proposed anesthesia with the patient or authorized representative who has indicated his/her understanding and acceptance.     Dental advisory given  Plan Discussed with: CRNA, Anesthesiologist and Surgeon  Anesthesia Plan Comments:         Anesthesia Quick Evaluation

## 2020-05-31 NOTE — Discharge Instructions (Signed)

## 2020-05-31 NOTE — Interval H&P Note (Signed)
History and Physical Interval Note: The patient understands that he is here today for a right total hip arthroplasty to treat the pain from his right hip osteoarthritis.  There has been no interval or acute changes in medical status.  See recent H&P.  The risk and benefits of surgery been explained in detail and informed consent is obtained.  The right hip is been marked.  05/31/2020 10:14 AM  Jermaine Mckay  has presented today for surgery, with the diagnosis of Osteoarthritis Right Hip.  The various methods of treatment have been discussed with the patient and family. After consideration of risks, benefits and other options for treatment, the patient has consented to  Procedure(s): RIGHT TOTAL HIP ARTHROPLASTY ANTERIOR APPROACH (Right) as a surgical intervention.  The patient's history has been reviewed, patient examined, no change in status, stable for surgery.  I have reviewed the patient's chart and labs.  Questions were answered to the patient's satisfaction.     Kathryne Hitch

## 2020-05-31 NOTE — Op Note (Signed)
NAMEAMIN, [place] MEDICAL RECORD [id number] ACCOUNT [id number] DATE OF BIRTH:12/07/1945 FACILITY: [facility] LOCATION: [facility] PHYSICIAN:Wonda Goodgame Aretha Parrot, MD  OPERATIVE REPORT  DATE OF PROCEDURE:  05/31/2020  PREOPERATIVE DIAGNOSIS:  Primary osteoarthritis and degenerative joint disease, right hip.  POSTOPERATIVE DIAGNOSIS:  Primary osteoarthritis and degenerative joint disease, right hip.  PROCEDURE:  Right total hip arthroplasty through direct anterior approach.  IMPLANTS:  DePuy Sector Gription acetabular component size 60, size 36+4 neutral polyethylene liner, size 12 Corail femoral component with high offset, size 36+5 metal hip ball.  SURGEON:  Doneen Poisson, MD  ASSISTANT:  ____, RNFA  ANESTHESIA:  Spinal.  ANTIBIOTICS:  Two grams IV Ancef.  ESTIMATED BLOOD LOSS:  250 mL.  COMPLICATIONS:  None.  INDICATIONS:  The patient is a 74 year old gentleman with debilitating arthritis involving his right hip.  At this point, his hip pain is detrimentally affecting his mobility, his quality of life and his activities of daily living.  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  At this point, with the detrimental effect this has had on his quality of life and mobility as well as activity of daily living he does wish to proceed with the total hip arthroplasty on the right side.  We  had a long and thorough discussion about the risk of acute blood loss anemia, nerve or vessel injury, fracture, infection, dislocation, DVT and implant failure.  We talked about skin and soft tissue postoperative issues as well.  We talked about our  goals being decreased pain, improved mobility and overall improved quality of life.  DESCRIPTION OF PROCEDURE:  After informed consent was obtained and appropriate right hip was marked.  He was brought to the operating room and sat up on a stretcher where spinal anesthesia was then obtained.  He was laid in supine position on a   stretcher.  Foley catheter was placed.  I had assessed his leg length and found them to have actually shorter on the right side, even though his x-rays look they are longer he is definitely shorter and we will compensate for that intraoperative with  x-rays.  Traction boots were placed on both his feet.  Next, he was placed supine on the Hana fracture table, the perineal post in place and both legs in line skeletal traction device and no traction applied.  His right operative hip was prepped and  draped with DuraPrep and sterile drapes.  A time-out was called.  He was identified as correct patient, correct right hip.  I then made an incision just inferior and posterior to the anterior iliac spine and carried this obliquely down the leg.  We  dissected down tensor fascia lata muscle.  Tensor fascia was then divided longitudinally to proceed with direct anterior approach to the hip.  We identified and cauterized circumflex vessels and identified the hip capsule.  I then opened the hip capsule  finding a moderate joint effusion with significant periarticular osteophytes around the femoral head and neck.  We then made our femoral neck cut with an oscillating saw just proximal to the lesser trochanter and completed this with an osteotome.  We  placed a corkscrew guide in the femoral head and removed the femoral heads in its entirety and found a wide area devoid of cartilage.  It was very large femoral head.  There was significant periarticular osteophytes around the acetabulum that were  removed.  We also removed remnants of acetabular labrum and other debris and placed a  bent Hohmann over the medial acetabular rim.  We then began reaming in stepwise increments from a small reamer we went all the way up to a size 59 with all reamers  placed under direct fluoroscopy.  The last reamer was placed under direct visualization and fluoroscopy, so we could obtain our depth of reaming, our inclination and anteversion.  I  then placed the real DePuy Sector Gription acetabular component size 60  and a 36+4 neutral polyethylene liner based off his high offset.  Attention was then turned to the femur, with the leg externally rotated to 120 degrees, extended and adducted, we replaced Mueller retractor medially and Hohmann retractor above the  greater trochanter, we released lateral joint capsule and used a box-cutting osteotome to enter the femoral canal and a rongeur to lateralize then began broaching using the Corail broaching system from a size 8 going up to a size 12.  With a size 12 in  place, we tried a high offset femoral neck and a 32+1.5 hip ball, reduced this in the acetabulum and we definitely needed more leg length and offset, but he was stable.  We dislocated the hip and removed the trial components.  We placed the real Corail  femoral component with high offset size 12.  With a 36+5 metal hip ball and again reduced this in the acetabulum.  We pleased with leg length, offset, range of motion and stability assessed radiographically and mechanically.  We then irrigated the soft  tissue with normal saline solution using pulsatile lavage.  We closed the joint capsule with interrupted #1 Ethibond suture, followed by closing the tensor fascia with #1 Vicryl.  0 Vicryl was used to close deep tissue and 2-0 Vicryl was used to close  subcutaneous tissue.  The skin was reapproximated with staples.  Xeroform and Aquacel dressing was applied.  He was taken off the Hana table and taken to recovery room in stable condition with all final counts being correct.  No complications noted.  HN/NUANCE  D:05/31/2020 T:05/31/2020 JOB:[id number]

## 2020-05-31 NOTE — Brief Op Note (Signed)
05/31/2020  12:50 PM  PATIENT:  Jermaine Mckay  74 y.o. male  PRE-OPERATIVE DIAGNOSIS:  Osteoarthritis Right Hip  POST-OPERATIVE DIAGNOSIS:  Osteoarthritis Right Hip  PROCEDURE:  Procedure(s): RIGHT TOTAL HIP ARTHROPLASTY ANTERIOR APPROACH (Right)  SURGEON:  Surgeon(s) and Role:    Kathryne Hitch, MD - Primary  PHYSICIAN ASSISTANT:  Rexene Edison, PA-C  ANESTHESIA:   spinal  EBL:  250 mL   COUNTS:  YES  DICTATION: .Other Dictation: Dictation Number [id number]  PLAN OF CARE: Admit for overnight observation  PATIENT DISPOSITION:  PACU - hemodynamically stable.   Delay start of Pharmacological VTE agent (>24hrs) due to surgical blood loss or risk of bleeding: no

## 2020-05-31 NOTE — Transfer of Care (Signed)
Immediate Anesthesia Transfer of Care Note  Patient: Jermaine Mckay  Procedure(s) Performed: RIGHT TOTAL HIP ARTHROPLASTY ANTERIOR APPROACH (Right Hip)  Patient Location: PACU  Anesthesia Type:MAC and Spinal  Level of Consciousness: drowsy, patient cooperative and responds to stimulation  Airway & Oxygen Therapy: Patient Spontanous Breathing and Patient connected to face mask oxygen  Post-op Assessment: Report given to RN and Post -op Vital signs reviewed and stable  Post vital signs: Reviewed and stable  Last Vitals:  Vitals Value Taken Time  BP    Temp    Pulse 64 05/31/20 1318  Resp 11 05/31/20 1318  SpO2 96 % 05/31/20 1318  Vitals shown include unvalidated device data.  Last Pain:  Vitals:   05/31/20 0901  TempSrc: Oral      Patients Stated Pain Goal: 4 (41/58/30 9407)  Complications: No complications documented.

## 2020-06-01 DIAGNOSIS — M1611 Unilateral primary osteoarthritis, right hip: Secondary | ICD-10-CM | POA: Diagnosis not present

## 2020-06-01 LAB — CBC
HCT: 39.9 % (ref 39.0–52.0)
Hemoglobin: 12.4 g/dL — ABNORMAL LOW (ref 13.0–17.0)
MCH: 27.4 pg (ref 26.0–34.0)
MCHC: 31.1 g/dL (ref 30.0–36.0)
MCV: 88.1 fL (ref 80.0–100.0)
Platelets: 243 10*3/uL (ref 150–400)
RBC: 4.53 MIL/uL (ref 4.22–5.81)
RDW: 14 % (ref 11.5–15.5)
WBC: 8.6 10*3/uL (ref 4.0–10.5)
nRBC: 0 % (ref 0.0–0.2)

## 2020-06-01 LAB — BASIC METABOLIC PANEL
Anion gap: 8 (ref 5–15)
BUN: 15 mg/dL (ref 8–23)
CO2: 27 mmol/L (ref 22–32)
Calcium: 8.8 mg/dL — ABNORMAL LOW (ref 8.9–10.3)
Chloride: 103 mmol/L (ref 98–111)
Creatinine, Ser: 0.77 mg/dL (ref 0.61–1.24)
GFR, Estimated: >60 mL/min (ref >60)
Glucose, Bld: 135 mg/dL — ABNORMAL HIGH (ref 70–99)
Potassium: 4.3 mmol/L (ref 3.5–5.1)
Sodium: 138 mmol/L (ref 135–145)

## 2020-06-01 NOTE — Discharge Summary (Addendum)
   Subjective: 1 Day Post-Op Procedure(s) (LRB): RIGHT TOTAL HIP ARTHROPLASTY ANTERIOR APPROACH (Right) Patient reports pain as mild.  Walking in halls with therapist , just went up and down stairs.   Objective: Vital signs in last 24 hours: Temp:  [95.8 F (35.4 C)-98.3 F (36.8 C)] 97.5 F (36.4 C) (11/06 0530) Pulse Rate:  [61-94] 94 (11/06 0530) Resp:  [10-20] 19 (11/06 0530) BP: (79-148)/(55-91) 145/91 (11/06 0530) SpO2:  [96 %-100 %] 97 % (11/06 0530)  Intake/Output from previous day: 11/05 0701 - 11/06 0700 In: 4306.4 [P.O.:1440; I.V.:2666.4; IV Piggyback:200] Out: 3900 [Urine:3650; Blood:250] Intake/Output this shift: Total I/O In: -  Out: 250 [Urine:250]  Recent Labs    06/01/20 0412  HGB 12.4*   Recent Labs    06/01/20 0412  WBC 8.6  RBC 4.53  HCT 39.9  PLT 243   Recent Labs    06/01/20 0412  NA 138  K 4.3  CL 103  CO2 27  BUN 15  CREATININE 0.77  GLUCOSE 135*  CALCIUM 8.8*   No results for input(s): LABPT, INR in the last 72 hours.  Neurologically intact DG Pelvis Portable  Result Date: 05/31/2020 CLINICAL DATA:  Status post right hip replacement. EXAM: PORTABLE PELVIS 1-2 VIEWS COMPARISON:  None. FINDINGS: The right acetabular and femoral components are well situated. Expected postoperative changes are seen in the surrounding soft tissues. IMPRESSION: Status post right total hip arthroplasty. Electronically Signed   By: Lupita Raider M.D.   On: 05/31/2020 14:15   DG C-Arm 1-60 Min-No Report  Result Date: 05/31/2020 Fluoroscopy was utilized by the requesting physician.  No radiographic interpretation.   DG HIP OPERATIVE UNILAT WITH PELVIS RIGHT  Result Date: 05/31/2020 CLINICAL DATA:  Right hip replacement. EXAM: OPERATIVE right HIP (WITH PELVIS IF PERFORMED) 3 VIEWS TECHNIQUE: Fluoroscopic spot image(s) were submitted for interpretation post-operatively. Radiation exposure index: 2.1446 mGy. COMPARISON:  May 07, 2020. FINDINGS: Three  intraoperative fluoroscopic images demonstrate the right acetabular and femoral components to be well situated. Expected postoperative changes are seen in the surrounding soft tissues. IMPRESSION: Status post right total hip arthroplasty. Electronically Signed   By: Lupita Raider M.D.   On: 05/31/2020 14:15    Assessment/Plan: 1 Day Post-Op Procedure(s) (LRB): RIGHT TOTAL HIP ARTHROPLASTY ANTERIOR APPROACH (Right) Discharge Home today. Rx sent . Dressing dry.   Jermaine Mckay 06/01/2020, 10:01 AM

## 2020-06-01 NOTE — Progress Notes (Signed)
Physical Therapy Treatment Patient Details Name: Jermaine Mckay MRN: [id number] DOB: 1946/06/21 Today's Date: 06/01/2020    History of Present Illness Patient is 74 y.o. male s/p Rt THA anterior approach on 05/31/20 with PMH significant for HTN, OA.    PT Comments    Pt ambulated in hallway and practiced safe step technique.  Pt also performed LE exercises and provided with HEP.  Pt reports understanding and had no further questions.  Pt ready for d/c home today.    Follow Up Recommendations  Follow surgeon's recommendation for DC plan and follow-up therapies;Outpatient PT     Equipment Recommendations  None recommended by PT    Recommendations for Other Services       Precautions / Restrictions Precautions Precautions: Fall Restrictions Other Position/Activity Restrictions: WBAT    Mobility  Bed Mobility Overal bed mobility: Needs Assistance Bed Mobility: Supine to Sit     Supine to sit: Supervision        Transfers Overall transfer level: Needs assistance Equipment used: Rolling walker (2 wheeled) Transfers: Sit to/from Stand Sit to Stand: Min guard;Supervision         General transfer comment: cues for safe hand placement on RW  Ambulation/Gait Ambulation/Gait assistance: Min guard;Supervision Gait Distance (Feet): 250 Feet Assistive device: Rolling walker (2 wheeled) Gait Pattern/deviations: Decreased stride length;Step-to pattern;Antalgic Gait velocity: decr   General Gait Details: verbal cues for sequence, pt prefers step to pattern, cues for RW positioning and posture   Stairs Stairs: Yes Stairs assistance: Min guard Stair Management: Forwards;With walker;Step to pattern Number of Stairs: 1 General stair comments: verbal cues for sequence and safety, RW positioning, pt performed twice and reports understanding   Wheelchair Mobility    Modified Rankin (Stroke Patients Only)       Balance                                             Cognition Arousal/Alertness: Awake/alert Behavior During Therapy: WFL for tasks assessed/performed Overall Cognitive Status: Within Functional Limits for tasks assessed                                        Exercises Total Joint Exercises Ankle Circles/Pumps: AROM;Both;20 reps Quad Sets: AROM;Both;10 reps Heel Slides: AAROM;Right;10 reps;Supine Hip ABduction/ADduction: 10 reps;AROM;Supine;Standing;Right Long Arc Quad: AROM;Right;10 reps;Seated Knee Flexion: AROM;Right;10 reps;Standing Marching in Standing: AROM;Right;10 reps;Standing Standing Hip Extension: AROM;Right;10 reps;Standing    General Comments        Pertinent Vitals/Pain Pain Assessment: Faces Faces Pain Scale: Hurts a little bit Pain Location: right hip Pain Descriptors / Indicators: Sore Pain Intervention(s): Repositioned;Monitored during session;Ice applied    Home Living                      Prior Function            PT Goals (current goals can now be found in the care plan section) Progress towards PT goals: Progressing toward goals    Frequency    7X/week      PT Plan Current plan remains appropriate    Co-evaluation              AM-PAC PT "6 Clicks" Mobility   Outcome Measure  Help needed turning from your back to your  side while in a flat bed without using bedrails?: None Help needed moving from lying on your back to sitting on the side of a flat bed without using bedrails?: A Little Help needed moving to and from a bed to a chair (including a wheelchair)?: A Little Help needed standing up from a chair using your arms (e.g., wheelchair or bedside chair)?: A Little Help needed to walk in hospital room?: A Little Help needed climbing 3-5 steps with a railing? : A Little 6 Click Score: 19    End of Session Equipment Utilized During Treatment: Gait belt Activity Tolerance: Patient tolerated treatment well Patient left: in chair;with call  bell/phone within reach;with nursing/sitter in room Nurse Communication: Mobility status PT Visit Diagnosis: Muscle weakness (generalized) (M62.81);Difficulty in walking, not elsewhere classified (R26.2)     Time: 5102-5852 PT Time Calculation (min) (ACUTE ONLY): 36 min  Charges:  $Gait Training: 8-22 mins $Therapeutic Exercise: 8-22 mins                     Thomasene Mohair PT, DPT Acute Rehabilitation Services Pager: [phone] Office: [phone]   Maida Sale E 06/01/2020, 1:42 PM

## 2020-06-01 NOTE — TOC Progression Note (Signed)
Transition of Care [facility]) - Progression Note    Patient Details  Name: Jermaine Mckay MRN: [id number] Date of Birth: Jul 30, 1945  [facility]) CM/SW Contact  Armanda Heritage, RN Phone Number: 06/01/2020, 12:18 PM  Clinical Narrative:    CM spoke with patient regarding HH and DME needs.  Patient reports he has rolling walker and 3in1 at home.  CM discussed with patient need for HHPT services.  Patient is from [place], [place] and CM made referrals to both of the [facility] agencies servicing that area.  Neither agency is able to give a definitive answer over the weekend to their ability to service.  Expect an answer Monday morning.  Patient and his daughter were made aware that an answer will not be available until Monday regarding HH availability in his service area.      Expected Discharge Plan: Home w Home Health Services Barriers to Discharge: No Barriers Identified  Expected Discharge Plan and Services Expected Discharge Plan: Home w Home Health Services   Discharge Planning Services: CM Consult Post Acute Care Choice: Home Health Living arrangements for the past 2 months: Single Family Home Expected Discharge Date: 06/01/20               DME Arranged: N/A         HH Arranged: PT HH Agency: Other - See comment (referral made to all agencies servicing area) Date HH Agency Contacted: 06/01/20 Time HH Agency Contacted: [id number]     Social Determinants of Health (SDOH) Interventions    Readmission Risk Interventions No flowsheet data found.

## 2020-06-03 ENCOUNTER — Encounter (HOSPITAL_COMMUNITY): Payer: Self-pay | Admitting: Orthopaedic Surgery

## 2020-06-04 NOTE — Progress Notes (Signed)
11/8-CSW followed up with HHPT arrangement. CSW confirm with the  Home Health agency [facility] ([address] [place], [place]) can provide PT services. The agency requests a copy of the Op note and discharge summary be faxed to the office at [phone]. CSW faxed the Op note and notified the physician about request for the D/C Summary.   TOC staff followed up with the patient, Jermaine Mckay reports the home health therapist did visit on 11/8.

## 2020-06-04 NOTE — Progress Notes (Addendum)
CM spoke with patient on 11/8 to confirm acceptance by [facility] for HHPT.  Patient reports agency has already come out to provide services.  [employer] rep reports agency needs copy of DC summary, MD office notified, CM has checked chart multiple times but no DC summary entered at this time.

## 2020-06-13 ENCOUNTER — Other Ambulatory Visit: Payer: Self-pay

## 2020-06-13 ENCOUNTER — Encounter: Payer: Self-pay | Admitting: Physician Assistant

## 2020-06-13 ENCOUNTER — Ambulatory Visit (INDEPENDENT_AMBULATORY_CARE_PROVIDER_SITE_OTHER): Payer: BC Managed Care – PPO | Admitting: Physician Assistant

## 2020-06-13 DIAGNOSIS — Z96641 Presence of right artificial hip joint: Secondary | ICD-10-CM

## 2020-06-13 NOTE — Progress Notes (Addendum)
HPI: Mr.  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  He overall is doing well.  He states that the hip is doing really well he is ambulating with a walker.  He denies any fevers chills.  He is taking aspirin for DVT prophylaxis.  Physical exam: Right hip surgical incisions healing well no signs of infection.  Wounds well approximated slight seroma.  Hips prepped with Betadine and then 56 cc of serous sanguinous fluid is aspirated patient tolerates well.  Right calf supple nontender.  Dorsiflexion plantarflexion right ankle intact.  Ambulates without an antalgic gait about the room.  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  He will continue the aspirin once daily for another week and then discontinue as he was on no aspirin prior to surgery.  Follow-up with [place] in 1 month sooner if there is any questions or concerns.

## 2020-07-11 ENCOUNTER — Ambulatory Visit (INDEPENDENT_AMBULATORY_CARE_PROVIDER_SITE_OTHER): Payer: BC Managed Care – PPO | Admitting: Physician Assistant

## 2020-07-11 ENCOUNTER — Encounter: Payer: Self-pay | Admitting: Physician Assistant

## 2020-07-11 DIAGNOSIS — Z96641 Presence of right artificial hip joint: Secondary | ICD-10-CM

## 2020-07-11 NOTE — Progress Notes (Signed)
HPI:.  He states he is overall doing well no questions concerns.  He is back on his regular 81 mg aspirin which he was on prior to surgery.  He is taking no pain medications.  Does relate to return back to work on 07/30/2020  Physical exam: Right hip excellent range of motion without pain.  He ambulates with a cane.  Calf supple nontender dorsiflexion plantarflexion of the right ankle intact.   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  We will see him sooner if there is any questions concerns.  Questions were encouraged and answered at length.

## 2020-12-09 ENCOUNTER — Ambulatory Visit (INDEPENDENT_AMBULATORY_CARE_PROVIDER_SITE_OTHER): Payer: BC Managed Care – PPO

## 2020-12-09 ENCOUNTER — Ambulatory Visit: Payer: BC Managed Care – PPO | Admitting: Physician Assistant

## 2020-12-09 ENCOUNTER — Encounter: Payer: Self-pay | Admitting: Physician Assistant

## 2020-12-09 DIAGNOSIS — M1612 Unilateral primary osteoarthritis, left hip: Secondary | ICD-10-CM | POA: Diagnosis not present

## 2020-12-09 DIAGNOSIS — M1712 Unilateral primary osteoarthritis, left knee: Secondary | ICD-10-CM

## 2020-12-09 DIAGNOSIS — Z96641 Presence of right artificial hip joint: Secondary | ICD-10-CM

## 2020-12-09 DIAGNOSIS — M25562 Pain in left knee: Secondary | ICD-10-CM | POA: Diagnosis not present

## 2020-12-09 MED ORDER — LIDOCAINE HCL 1 % IJ SOLN
3.0000 mL | INTRAMUSCULAR | Status: AC | PRN
  Administered 2020-12-09: 3 mL

## 2020-12-09 MED ORDER — METHYLPREDNISOLONE ACETATE 40 MG/ML IJ SUSP
40.0000 mg | INTRAMUSCULAR | Status: AC | PRN
  Administered 2020-12-09: 40 mg via INTRA_ARTICULAR

## 2020-12-09 NOTE — Progress Notes (Signed)
Office Visit Note   Patient: Jermaine Mckay           Date of Birth: March 18, 1946           MRN: [id number] Visit Date: 12/09/2020              Requested by: Chari Manning, DO [address] [place],  [place] [address] PCP: Chari Manning, DO   Assessment & Plan: Visit Diagnoses:  1. Primary osteoarthritis of left knee   2. Status post total replacement of right hip   3. Primary osteoarthritis of left hip     Plan: Discussed with him that he can have cortisone injection in the left knee no more often than every 3 months.  In regards to the right hip we will see him back at 1 year postop and obtain an AP pelvis.  He will follow-up with [place] on an as-needed basis otherwise.  Follow-Up Instructions: Return in about 6 months (around 06/11/2021).   Orders:  Orders Placed This Encounter  Procedures  . Large Joint Inj: L knee  . XR HIP UNILAT W OR W/O PELVIS 2-3 VIEWS RIGHT  . XR Knee 1-2 Views Left   No orders of the defined types were placed in this encounter.     Procedures: Large Joint Inj: L knee on 12/09/2020 3:03 PM Indications: pain Details: 22 G 1.5 in needle, anterolateral approach  Arthrogram: No  Medications: 3 mL lidocaine 1 %; 40 mg methylPREDNISolone acetate 40 MG/ML Outcome: tolerated well, no immediate complications Procedure, treatment alternatives, risks and benefits explained, specific risks discussed. Consent was given by the patient. Immediately prior to procedure a time out was called to verify the correct patient, procedure, equipment, support staff and site/side marked as required. Patient was prepped and draped in the usual sterile fashion.       Clinical Data: No additional findings.   Subjective: Chief Complaint  Patient presents with  . Right Hip - Routine Post Op  . Left Knee - Pain    HPI [name] is well-known to Dr. 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  He states his  right hip is doing well he has no complaints.  Denies any pain in the left hip.  His complaint today is left knee pain.  He states the knee is stiff and would like an injection.  He has had no known injury to the left knee.  He denies any fevers chills shortness of breath chest pain he is nondiabetic. Review of Systems See HPI  Objective: Vital Signs: There were no vitals taken for this visit.  Physical Exam General: Well-developed well-nourished male no acute distress mood and affect appropriate Ortho Exam Right hip excellent range of motion without pain.  Left hip slight decreased range of motion but no pain with internal or external rotation.  Bilateral knees no abnormal warmth erythema or effusion.  Good range of motion both knees with significant patellofemoral crepitus. Specialty Comments:  No specialty comments available.  Imaging: XR HIP UNILAT W OR W/O PELVIS 2-3 VIEWS RIGHT  Result Date: 12/09/2020 AP pelvis lateral view of the right hip: Both hips are well located.  Status post right total hip arthroplasty with well-seated components.  Severe arthritis left hip.  No acute fractures.  XR Knee 1-2 Views Left  Result Date: 12/09/2020 Left knee 2 views: No acute fractures.  Knee is well located.  Tricompartmental arthritic changes with near bone-on-bone medial compartment  and severe patellofemoral compartment changes.  Mild lateral compartmental changes.    PMFS History: Patient Active Problem List   Diagnosis Date Noted  . 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 Primary osteoarthritis of left hip 08/24/2019  . Primary osteoarthritis of right hip 08/24/2019  . Bilateral primary osteoarthritis of knee 12/01/2018  . Encounter for screening for malignant neoplasm of prostate 05/19/2016  . Essential (primary) hypertension 05/19/2016  . Microscopic hematuria 05/19/2016  . Personal history of urinary (tract) infections 05/19/2016  . Ventricular premature beats  05/19/2016  . Benign prostatic hyperplasia without lower urinary tract symptoms 05/19/2016  . 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 Arthritis   . Hypertension     History reviewed. No pertinent family history.  Past Surgical History:  Procedure Laterality Date  . NO PAST SURGERIES    . TOTAL HIP ARTHROPLASTY Right 05/31/2020   Procedure: RIGHT TOTAL HIP ARTHROPLASTY ANTERIOR APPROACH;  Surgeon: Kathryne Hitch, MD;  Location: [facility];  Service: Orthopedics;  Laterality: Right;   Social History   Occupational History  . Not on file  Tobacco Use  . Smoking status: Never Smoker  . Smokeless tobacco: Never Used  Vaping Use  . Vaping Use: Never used  Substance and Sexual Activity  . Alcohol use: Never  . Drug use: Never  . Sexual activity: Not on file

## 2021-06-11 ENCOUNTER — Ambulatory Visit: Payer: Self-pay

## 2021-06-11 ENCOUNTER — Ambulatory Visit (INDEPENDENT_AMBULATORY_CARE_PROVIDER_SITE_OTHER): Payer: BC Managed Care – PPO | Admitting: Orthopaedic Surgery

## 2021-06-11 ENCOUNTER — Encounter: Payer: Self-pay | Admitting: Orthopaedic Surgery

## 2021-06-11 DIAGNOSIS — M1712 Unilateral primary osteoarthritis, left knee: Secondary | ICD-10-CM | POA: Diagnosis not present

## 2021-06-11 DIAGNOSIS — M1612 Unilateral primary osteoarthritis, left hip: Secondary | ICD-10-CM

## 2021-06-11 DIAGNOSIS — Z96641 Presence of right artificial hip joint: Secondary | ICD-10-CM

## 2021-06-11 MED ORDER — METHYLPREDNISOLONE ACETATE 40 MG/ML IJ SUSP
40.0000 mg | INTRAMUSCULAR | Status: AC | PRN
  Administered 2021-06-11: 40 mg via INTRA_ARTICULAR

## 2021-06-11 MED ORDER — LIDOCAINE HCL 1 % IJ SOLN
3.0000 mL | INTRAMUSCULAR | Status: AC | PRN
  Administered 2021-06-11: 3 mL

## 2021-06-11 NOTE — Progress Notes (Signed)
Office Visit Note   Patient: Jermaine Mckay           Date of Birth: Feb 10, 1946           MRN: [id number] Visit Date: 06/11/2021              Requested by: Sabino Snipes, [place] [address] PCP: Sabino Snipes, DO   Assessment & Plan: Visit Diagnoses:  1. Status post total replacement of right hip   2. Primary osteoarthritis of left knee   3. Primary osteoarthritis of left hip     Plan:  He will follow-up with [place] as needed.  He understands he needs to wait at least 3 months between cortisone injections in both knees.  Questions were encouraged and answered at length by Dr. Ninfa Linden and myself.  Follow-Up Instructions: Return if symptoms worsen or fail to improve.   Orders:  Orders Placed This Encounter  Procedures   Large Joint Inj   XR Pelvis 1-2 Views   No orders of the defined types were placed in this encounter.     Procedures: Large Joint Inj: L knee on 06/11/2021 10:27 AM Indications: pain Details: 22 G 1.5 in needle, anterolateral approach  Arthrogram: No  Medications: 3 mL lidocaine 1 %; 40 mg methylPREDNISolone acetate 40 MG/ML Outcome: tolerated well, no immediate complications Procedure, treatment alternatives, risks and benefits explained, specific risks discussed. Consent was given by the patient. Immediately prior to procedure a time out was called to verify the correct patient, procedure, equipment, support staff and site/side marked as required. Patient was prepped and draped in the usual sterile fashion.      Clinical Data: No additional findings.   Subjective: Chief Complaint  Patient presents with   Right Hip - Routine Post Op   Left Knee - Follow-up    HPI Jermaine Mckay returns today status post right total hip arthroplasty 05/31/2021.  He states he is doing well in regards to her right hip.  He has no complaints as far as the right hip.  He has known end-stage arthritis of the left knee and left hip.   States that they just feels stiff.  He has had some increased pain in the left knee recently.  He is requesting a repeat injection in his left knee.  Last had a cortisone injection 12/09/2020.  Reports no medical history change.  Review of Systems  Constitutional:  Negative for chills and fever.    Objective: Vital Signs: There were no vitals taken for this visit.  Physical Exam Constitutional:      Appearance: He is not ill-appearing or diaphoretic.  Pulmonary:     Effort: Pulmonary effort is normal.  Neurological:     Mental Status: He is alert and oriented to person, place, and time.  Psychiatric:        Mood and Affect: Mood normal.    Ortho Exam Right hip good range of motion with slight stiffness with internal and external rotation.  Left hip no internal or external rotation.  Attempts at motion.  Ambulates with assistive device.  Left knee no abnormal warmth erythema.  Has full extension and flexion to approximately 105 degrees.  Passive range of motion of the left knee reveals patellofemoral crepitus.  There is no abnormal warmth ,erythema or effusion left knee.   Specialty Comments:  No specialty comments available.  Imaging: XR Pelvis 1-2 Views  Result Date: 06/11/2021 AP pelvis: Bilateral hips well located.  Status post right total hip arthroplasty with well-seated components.  No hardware failure bony abnormalities involving the right hip.  Left hip end-stage arthritis.  No acute findings otherwise.    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   Primary osteoarthritis of left hip 08/24/2019   Primary osteoarthritis of right hip 08/24/2019   Bilateral primary osteoarthritis of knee 12/01/2018   Encounter for screening for malignant neoplasm of prostate 05/19/2016   Essential (primary) hypertension 05/19/2016   Microscopic hematuria 05/19/2016   Personal history of urinary (tract)  infections 05/19/2016   Ventricular premature beats 05/19/2016   Benign prostatic hyperplasia without lower urinary tract symptoms 05/19/2016   ACE inhibitor-aggravated angioedema 05/19/2016   Past Medical History:  Diagnosis Date   Arthritis    Hypertension     History reviewed. No pertinent family history.  Past Surgical History:  Procedure Laterality Date   NO PAST SURGERIES     TOTAL HIP ARTHROPLASTY Right 05/31/2020   Procedure: RIGHT TOTAL HIP ARTHROPLASTY ANTERIOR APPROACH;  Surgeon: Kathryne Hitch, MD;  Location: [facility];  Service: Orthopedics;  Laterality: Right;   Social History   Occupational History   Not on file  Tobacco Use   Smoking status: Never   Smokeless tobacco: Never  Vaping Use   Vaping Use: Never used  Substance and Sexual Activity   Alcohol use: Never   Drug use: Never   Sexual activity: Not on file

## 2022-08-30 IMAGING — RF DG HIP (WITH PELVIS) OPERATIVE*R*
1 series · 3 of 3 positions shown · non-contrast
Comparison: May 07, 2020.

CLINICAL DATA: Right hip replacement.

EXAM:
OPERATIVE right HIP (WITH PELVIS IF PERFORMED) 3 VIEWS
TECHNIQUE: Fluoroscopic spot image(s) were submitted for interpretation
post-operatively.
Radiation exposure index: 2.1446 mGy.

[Series 1: unknown protocol · 0.20mm/px · 3 of 3 slices shown]
[im 1/3]
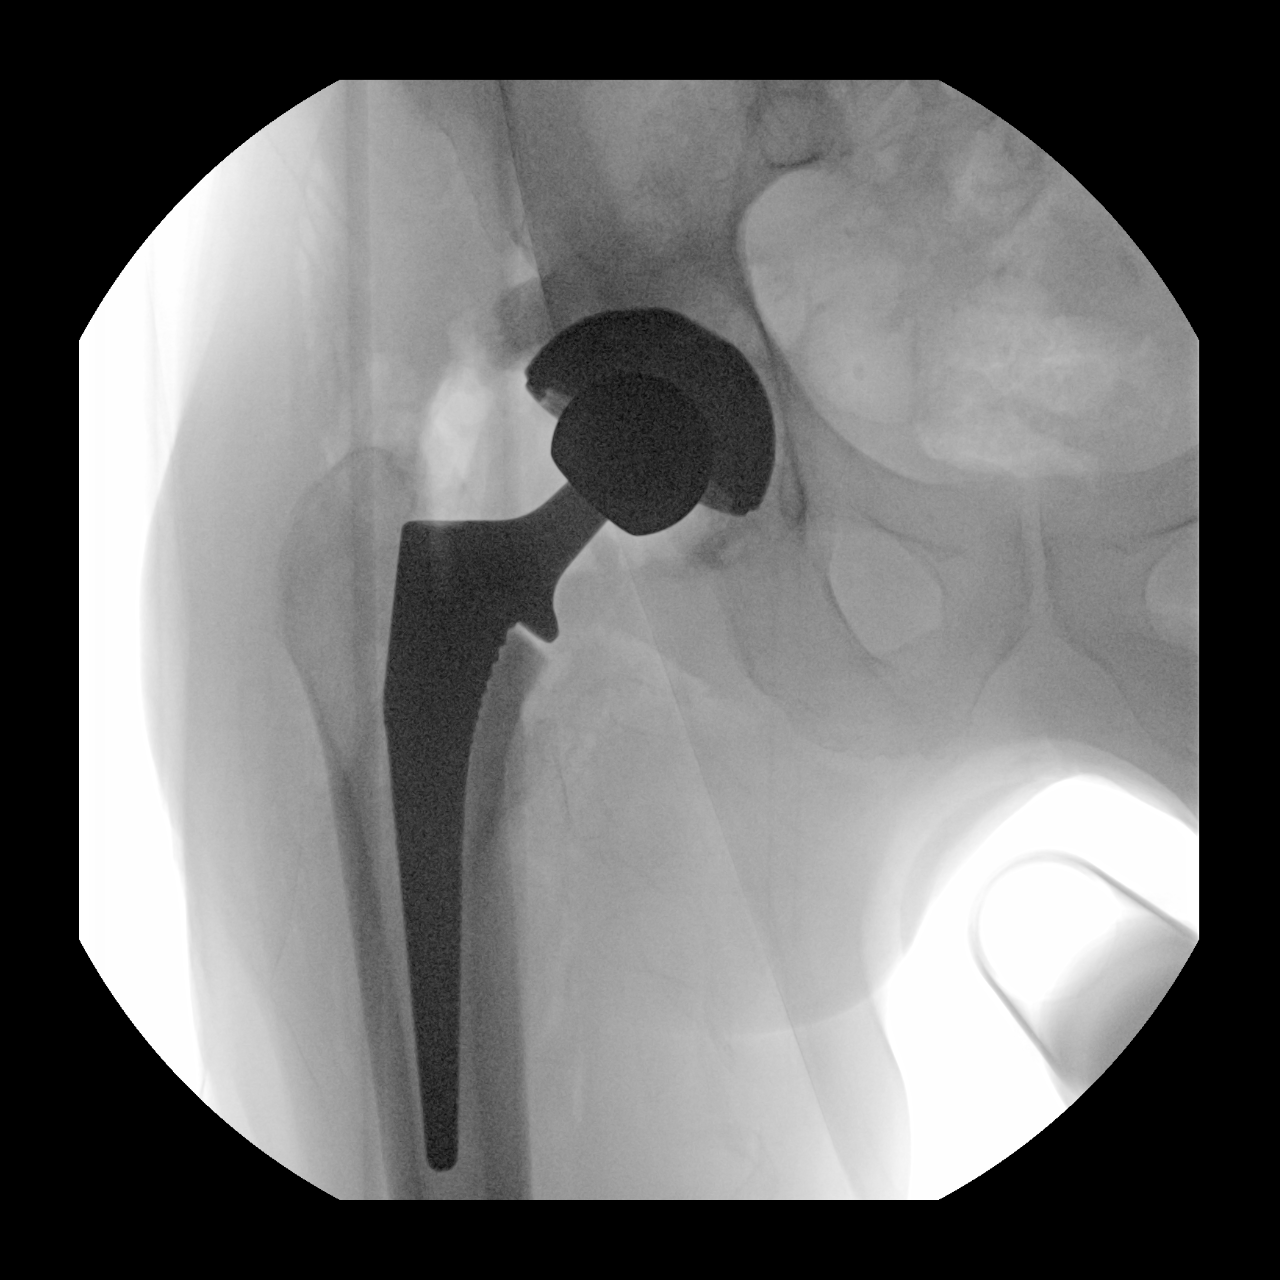
[im 2/3]
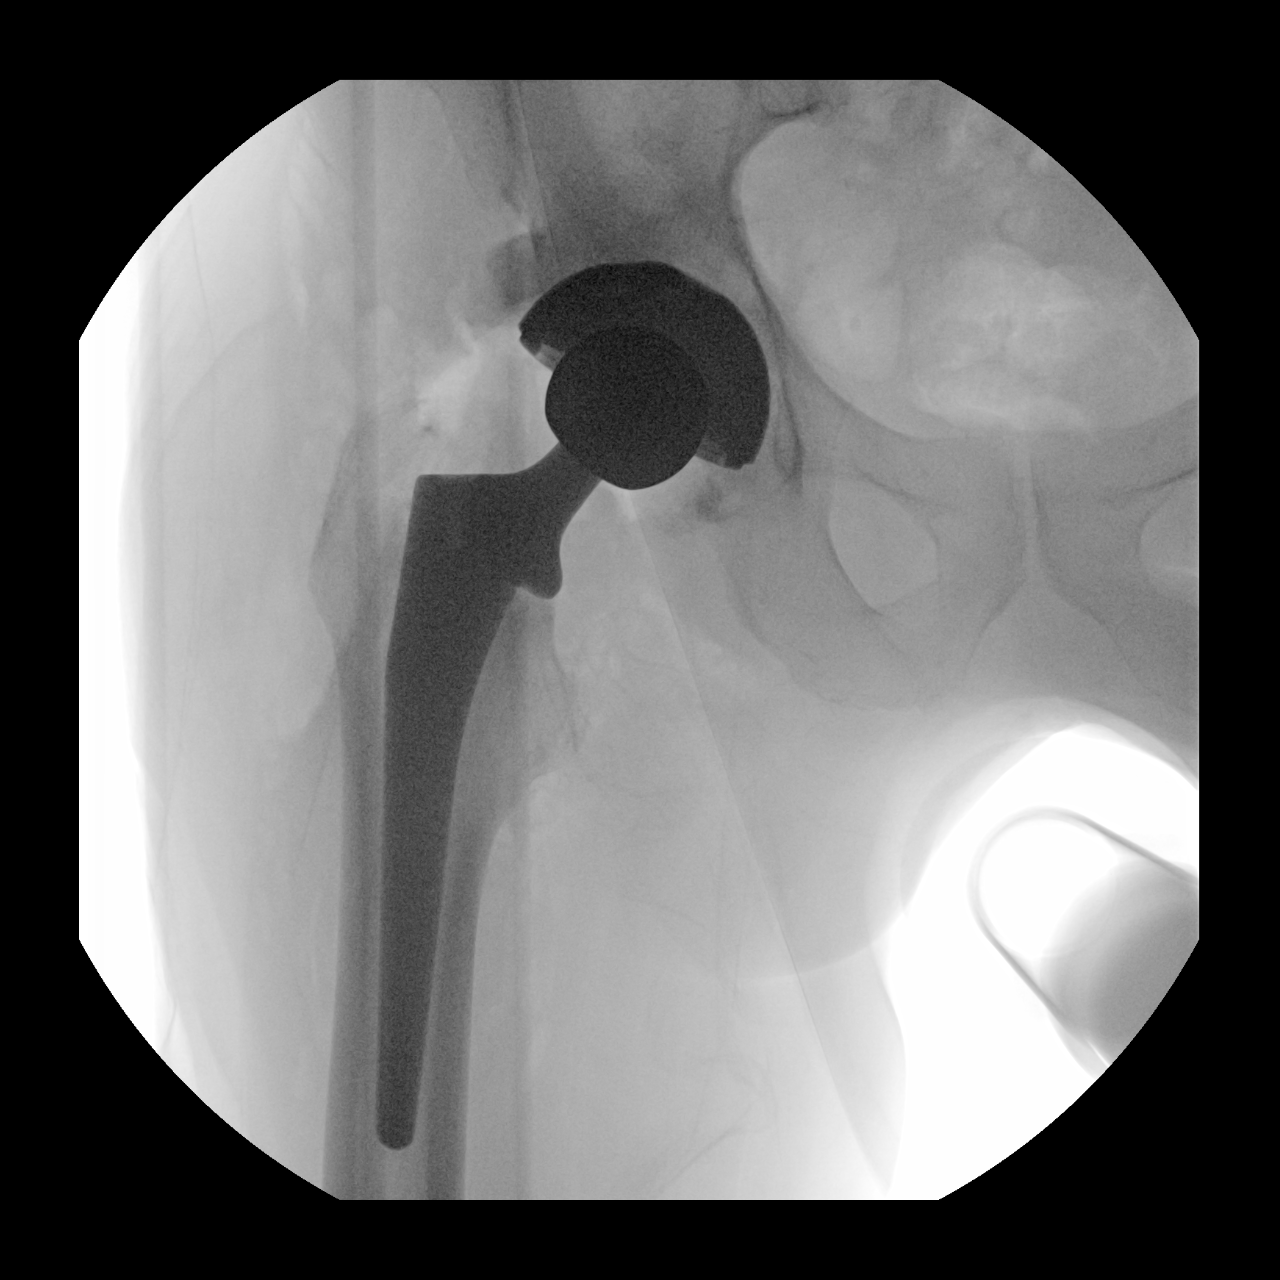
[im 3/3]
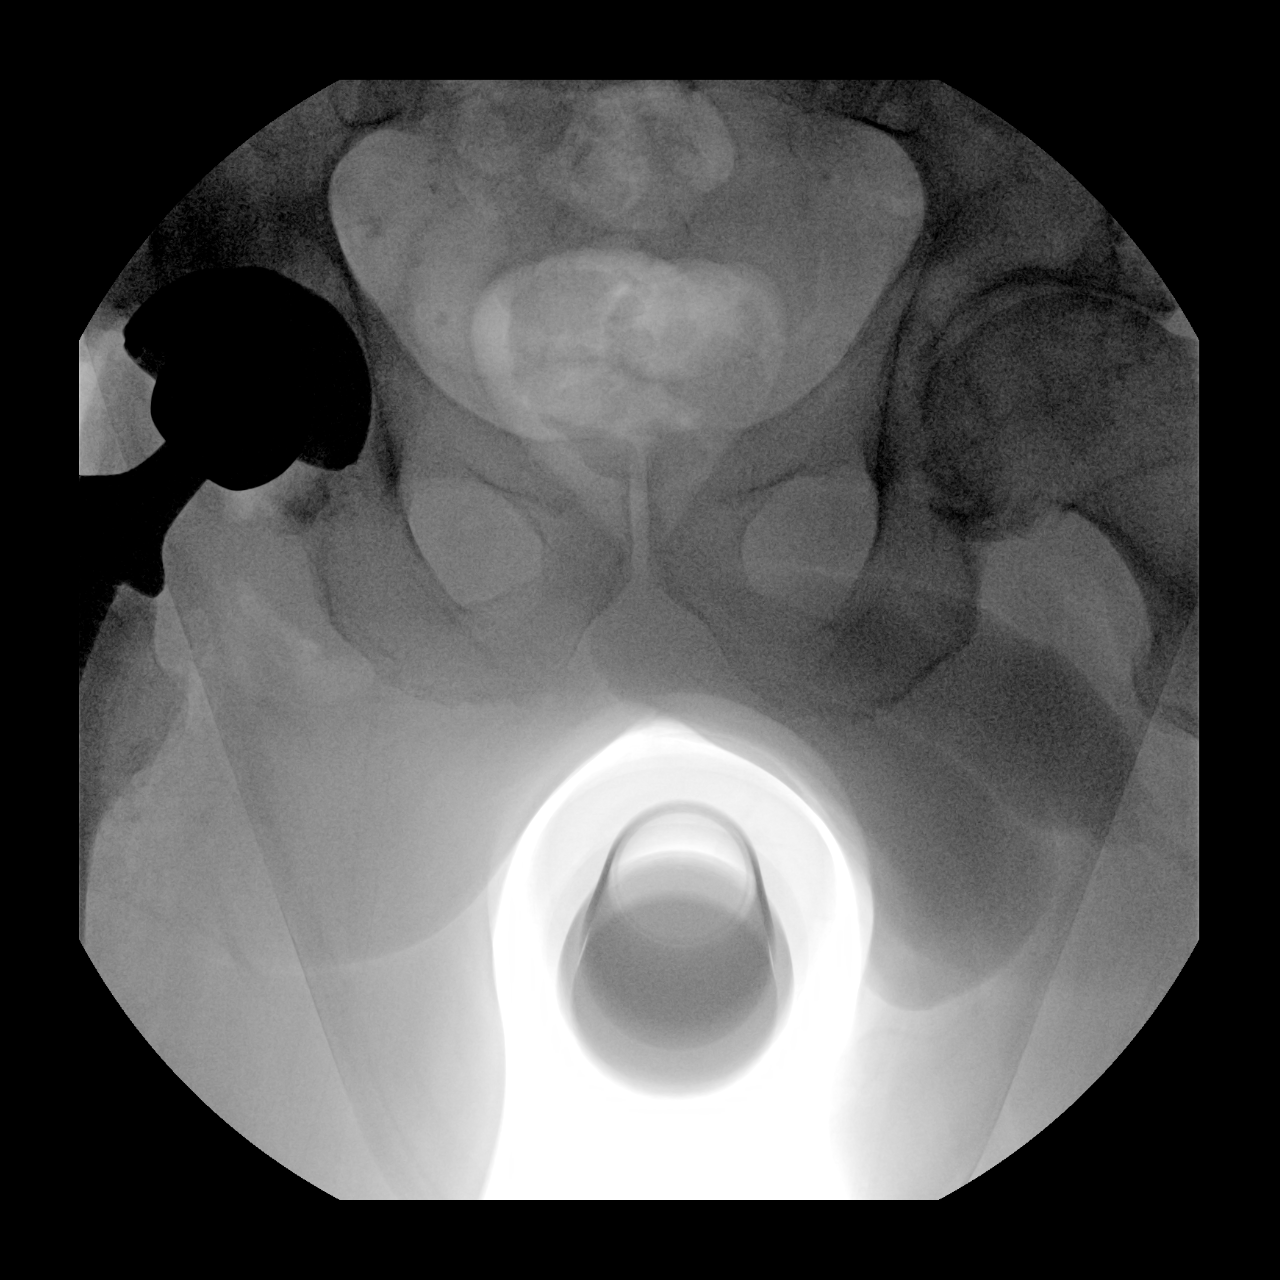

[3 of 3 positions shown; findings below may reference images not displayed]

FINDINGS: Three intraoperative fluoroscopic images demonstrate the right
acetabular and femoral components to be well situated. Expected
postoperative changes are seen in the surrounding soft tissues.
IMPRESSION: Status post right total hip arthroplasty.

## 2022-08-30 IMAGING — DX DG PORTABLE PELVIS
1 series · 1 of 1 positions shown · non-contrast
Comparison: None.

CLINICAL DATA: Status post right hip replacement.

EXAM:
PORTABLE PELVIS 1-2 VIEWS

[pelvis ap]
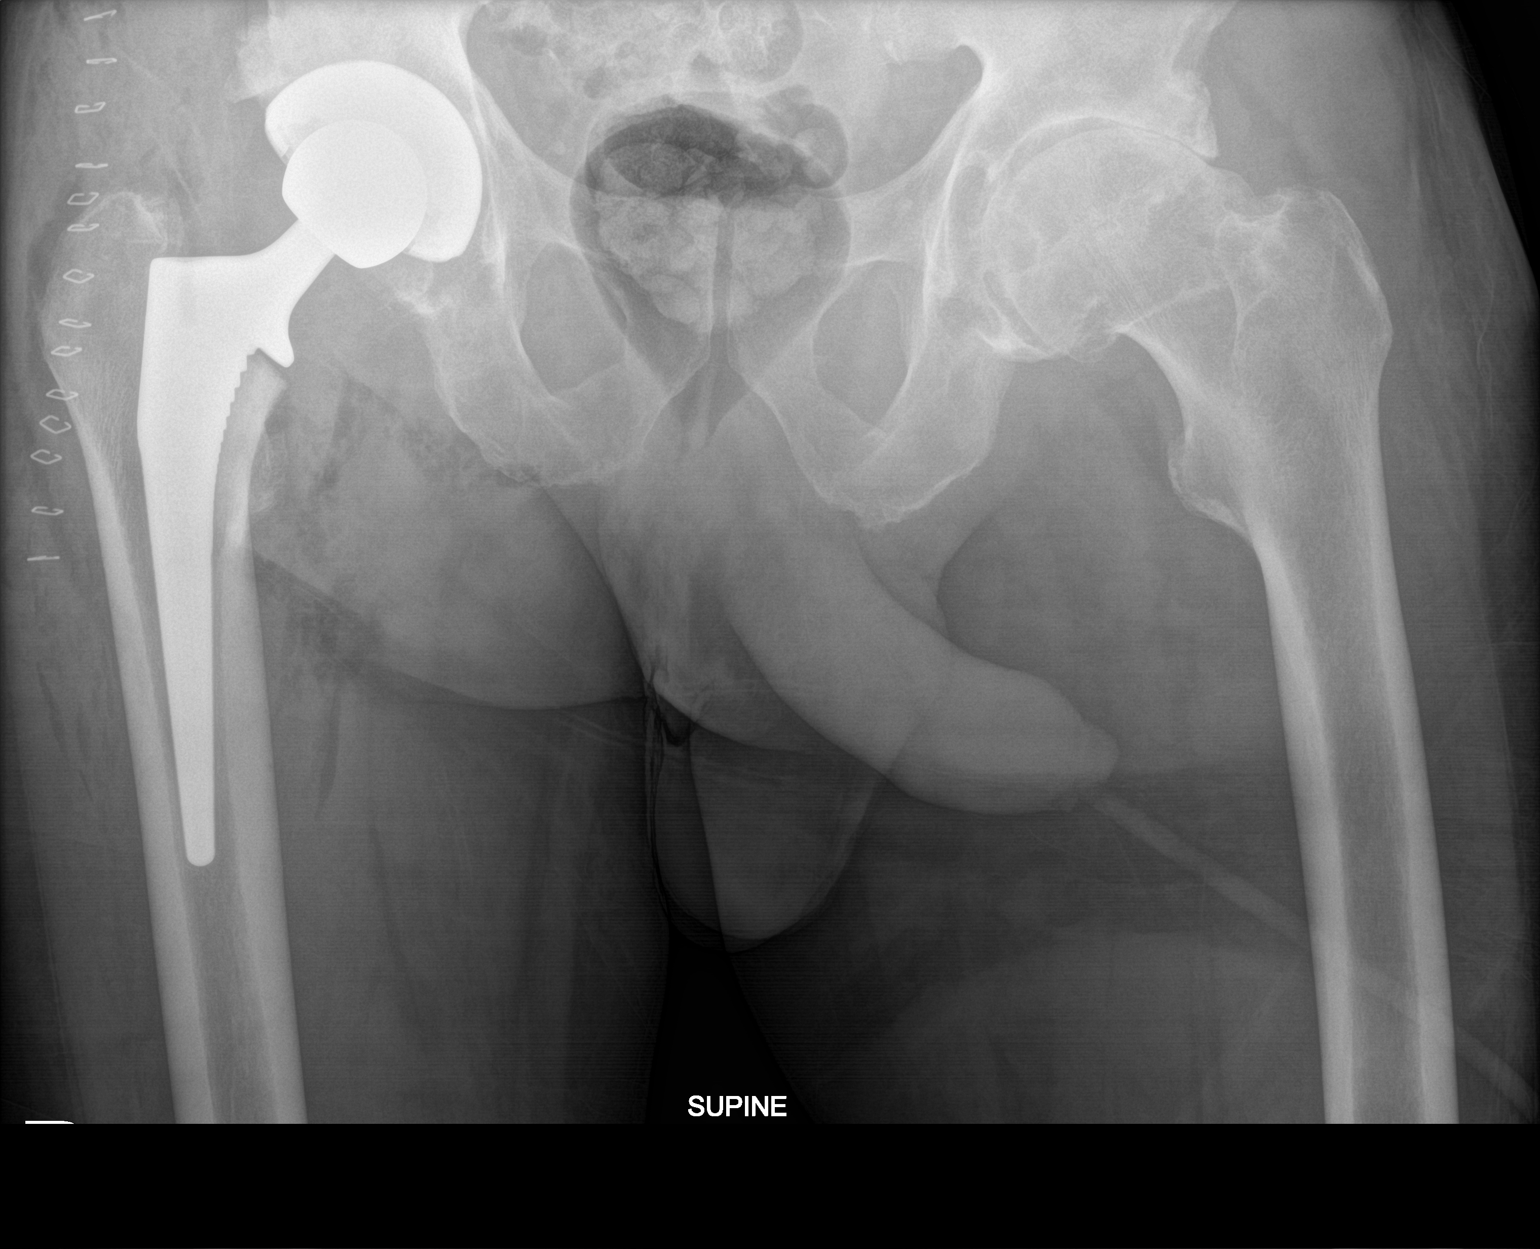

[1 of 1 positions shown; findings below may reference images not displayed]

FINDINGS: The right acetabular and femoral components are well situated.
Expected postoperative changes are seen in the surrounding soft
tissues.
IMPRESSION: Status post right total hip arthroplasty.
